# Patient Record
Sex: Female | Born: 1975 | Hispanic: No | Marital: Married | Smoking: Never smoker
Health system: Southern US, Community
[De-identification: ages and names within clinical notes are randomized; demographics above are authoritative.]

## PROBLEM LIST (undated history)

## (undated) DIAGNOSIS — D649 Anemia, unspecified: Secondary | ICD-10-CM

## (undated) HISTORY — DX: Anemia, unspecified: D64.9

---

## 2001-06-05 ENCOUNTER — Other Ambulatory Visit: Admission: RE | Admit: 2001-06-05 | Discharge: 2001-06-05 | Payer: Self-pay | Admitting: Obstetrics & Gynecology

## 2001-11-11 ENCOUNTER — Inpatient Hospital Stay (HOSPITAL_COMMUNITY): Admission: AD | Admit: 2001-11-11 | Discharge: 2001-11-11 | Payer: Self-pay | Admitting: Obstetrics and Gynecology

## 2001-11-13 ENCOUNTER — Encounter: Payer: Self-pay | Admitting: Obstetrics and Gynecology

## 2001-11-13 ENCOUNTER — Inpatient Hospital Stay (HOSPITAL_COMMUNITY): Admission: AD | Admit: 2001-11-13 | Discharge: 2001-11-13 | Payer: Self-pay | Admitting: *Deleted

## 2001-11-16 ENCOUNTER — Inpatient Hospital Stay (HOSPITAL_COMMUNITY): Admission: AD | Admit: 2001-11-16 | Discharge: 2001-11-18 | Payer: Self-pay | Admitting: *Deleted

## 2005-09-18 ENCOUNTER — Inpatient Hospital Stay (HOSPITAL_COMMUNITY): Admission: AD | Admit: 2005-09-18 | Discharge: 2005-09-18 | Payer: Self-pay | Admitting: Obstetrics and Gynecology

## 2005-09-20 ENCOUNTER — Inpatient Hospital Stay (HOSPITAL_COMMUNITY): Admission: AD | Admit: 2005-09-20 | Discharge: 2005-09-20 | Payer: Self-pay | Admitting: Obstetrics and Gynecology

## 2006-03-12 ENCOUNTER — Ambulatory Visit (HOSPITAL_COMMUNITY): Admission: RE | Admit: 2006-03-12 | Discharge: 2006-03-12 | Payer: Self-pay | Admitting: Emergency Medicine

## 2006-03-29 ENCOUNTER — Ambulatory Visit: Payer: Self-pay | Admitting: Family Medicine

## 2006-04-04 ENCOUNTER — Ambulatory Visit: Payer: Self-pay | Admitting: Sports Medicine

## 2006-05-04 ENCOUNTER — Ambulatory Visit: Payer: Self-pay | Admitting: Family Medicine

## 2006-06-05 ENCOUNTER — Ambulatory Visit: Payer: Self-pay | Admitting: Family Medicine

## 2006-06-07 ENCOUNTER — Ambulatory Visit (HOSPITAL_COMMUNITY): Admission: RE | Admit: 2006-06-07 | Discharge: 2006-06-07 | Payer: Self-pay | Admitting: Sports Medicine

## 2006-07-04 ENCOUNTER — Ambulatory Visit: Payer: Self-pay | Admitting: Family Medicine

## 2006-07-31 ENCOUNTER — Ambulatory Visit: Payer: Self-pay | Admitting: Family Medicine

## 2006-08-17 ENCOUNTER — Ambulatory Visit: Payer: Self-pay | Admitting: Family Medicine

## 2006-08-31 ENCOUNTER — Ambulatory Visit: Payer: Self-pay | Admitting: Family Medicine

## 2006-09-14 ENCOUNTER — Ambulatory Visit: Payer: Self-pay | Admitting: Family Medicine

## 2006-09-14 ENCOUNTER — Encounter (INDEPENDENT_AMBULATORY_CARE_PROVIDER_SITE_OTHER): Payer: Self-pay | Admitting: Family Medicine

## 2006-09-28 ENCOUNTER — Ambulatory Visit: Payer: Self-pay | Admitting: Family Medicine

## 2006-10-05 ENCOUNTER — Ambulatory Visit: Payer: Self-pay | Admitting: Family Medicine

## 2006-10-12 ENCOUNTER — Ambulatory Visit: Payer: Self-pay | Admitting: Family Medicine

## 2006-10-14 ENCOUNTER — Inpatient Hospital Stay (HOSPITAL_COMMUNITY): Admission: AD | Admit: 2006-10-14 | Discharge: 2006-10-16 | Payer: Self-pay | Admitting: Obstetrics & Gynecology

## 2006-10-14 ENCOUNTER — Ambulatory Visit: Payer: Self-pay | Admitting: *Deleted

## 2006-12-20 ENCOUNTER — Ambulatory Visit: Payer: Self-pay | Admitting: Family Medicine

## 2006-12-20 DIAGNOSIS — M722 Plantar fascial fibromatosis: Secondary | ICD-10-CM | POA: Insufficient documentation

## 2006-12-20 DIAGNOSIS — M79609 Pain in unspecified limb: Secondary | ICD-10-CM | POA: Insufficient documentation

## 2007-01-18 ENCOUNTER — Ambulatory Visit: Payer: Self-pay | Admitting: Sports Medicine

## 2007-01-18 LAB — CONVERTED CEMR LAB: Beta hcg, urine, semiquantitative: NEGATIVE

## 2015-01-25 ENCOUNTER — Ambulatory Visit: Payer: Self-pay

## 2015-09-10 LAB — POCT GLUCOSE (2 HR PP): Glucose 2 Hr PP, POC: 81 mg/dL

## 2016-12-05 ENCOUNTER — Ambulatory Visit (INDEPENDENT_AMBULATORY_CARE_PROVIDER_SITE_OTHER): Payer: Self-pay | Admitting: Family Medicine

## 2016-12-05 VITALS — BP 123/75 | HR 82 | Temp 98.1°F | Resp 18 | Ht 63.0 in | Wt 146.2 lb

## 2016-12-05 DIAGNOSIS — K5909 Other constipation: Secondary | ICD-10-CM

## 2016-12-05 DIAGNOSIS — R1084 Generalized abdominal pain: Secondary | ICD-10-CM

## 2016-12-05 LAB — POCT URINALYSIS DIP (MANUAL ENTRY)
BILIRUBIN UA: NEGATIVE
GLUCOSE UA: NEGATIVE mg/dL
Ketones, POC UA: NEGATIVE mg/dL
NITRITE UA: NEGATIVE
Protein Ur, POC: NEGATIVE mg/dL
SPEC GRAV UA: 1.02 (ref 1.010–1.025)
Urobilinogen, UA: 0.2 E.U./dL
pH, UA: 7 (ref 5.0–8.0)

## 2016-12-05 NOTE — Progress Notes (Signed)
  Chief Complaint  Patient presents with  . Abdominal Cramping    wants referral to GI   . Abdominal Pain    HPI   Constipation: Patient complains of constipation.  Stool pattern has been 2 formed stool(s) per week. Onset was 12 years ago but 1.5 months ago got really bad.  Defecation has been difficult, incomplete and painful. Co-Morbid conditions:none. Symptoms have been gradually worsening. Current Health Habits: Eating fiber? yes, amt lots of fruits and vegetables and metamucil. Exercise?yes - daily Water intake? yes, amt eight 8 ounce glasses a day. Current OTC/RX therapy has been bulk (psyllium), lubricant (mineral oil) and over the counter laxatives which do not help.   She reports leakage of stool with fecal incontinence twice in the past 1.5 months She reports nausea and upset stomach She is unable to eat a full plate  She has never had a colonoscopy   Past Medical History:  Diagnosis Date  . Anemia     No current outpatient prescriptions on file.   No current facility-administered medications for this visit.     Allergies: No Known Allergies  History reviewed. No pertinent surgical history.  Social History   Social History  . Marital status: Married    Spouse name: N/A  . Number of children: N/A  . Years of education: N/A   Social History Main Topics  . Smoking status: Never Smoker  . Smokeless tobacco: Never Used  . Alcohol use No  . Drug use: No  . Sexual activity: Not Asked   Other Topics Concern  . None   Social History Narrative  . None    ROS  Objective: Vitals:   12/05/16 1634  BP: 123/75  Pulse: 82  Resp: 18  Temp: 98.1 F (36.7 C)  TempSrc: Oral  SpO2: 99%  Weight: 146 lb 3.2 oz (66.3 kg)  Height: 5\' 3"  (1.6 m)    Physical Exam  Constitutional: She appears well-developed and well-nourished.  HENT:  Head: Normocephalic and atraumatic.  Cardiovascular: Normal rate, regular rhythm and normal heart sounds.   Pulmonary/Chest:  Effort normal and breath sounds normal. No respiratory distress. She has no wheezes.  Abdominal: Soft. Bowel sounds are normal. She exhibits no shifting dullness, no distension, no pulsatile liver, no fluid wave, no abdominal bruit, no ascites, no pulsatile midline mass and no mass. There is tenderness.      Holly Miranda as chaperone Rectal exam: normal tone No palpable mass No visible or palpable hemorrhoid    Assessment and Plan Holly Miranda was seen today for abdominal cramping and abdominal pain.  Diagnoses and all orders for this visit:  Generalized abdominal pain -     POCT urinalysis dipstick -     Cancel: POCT Microscopic Urinalysis ([facility]) -     CBC with Differential/Platelet  Chronic constipation -     TSH -     Comprehensive metabolic panel -     Ambulatory referral to Gastroenterology     [place]

## 2016-12-05 NOTE — Patient Instructions (Addendum)
     IF you received an x-ray today, you will receive an invoice from [facility] ([facility] Radiology. Please contact [facility] at [phone] with questions or concerns regarding your invoice.   IF you received labwork today, you will receive an invoice from [place]. Please contact [facility] at [phone] with questions or concerns regarding your invoice.   Our billing staff will not be able to assist you with questions regarding bills from these companies.  You will be contacted with the lab results as soon as they are available. The fastest way to get your results is to activate your My Chart account. Instructions are located on the last page of this paperwork. If you have not heard from [place] regarding the results in 2 weeks, please contact this office.     \ Diverticulosis (Diverticulosis) La diverticulosis es una enfermedad que aparece cuando se forman pequeos bolsillos (divertculos) en las paredes del colon. El colon, o intestino grueso, es el lugar donde se absorbe agua y se forman las heces. Los bolsillos se forman cuando la capa interna del colon ejerce presin sobre los puntos dbiles de las capas externas. CAUSAS Nadie sabe con exactitud qu causa la diverticulosis. FACTORES DE RIESGO  Ser mayor de 3aos. El riesgo de desarrollar esta enfermedad aumenta con la edad. La diverticulosis es poco frecuente en las personas menores de [place]. A los 80aos, casi todas las personas tienen la enfermedad.  Comer una dieta con bajo contenido de [place].  Estar estreido con frecuencia.  Tener sobrepeso.  No hacer suficiente ejercicio fsico.  Fumar.  Tomar analgsicos de venta libre, como aspirina e ibuprofeno. SNTOMAS La mayora de las personas que tienen diverticulosis no presentan sntomas. DIAGNSTICO Dado que la diverticulosis no suele causar sntomas, los mdicos a menudo descubren la enfermedad durante un examen de otros problemas de colon. En muchos casos, el mdico  diagnosticar la diverticulosis mientras utiliza un endoscopio flexible para examinar el colon (colonoscopa). TRATAMIENTO Si nunca tuvo una infeccin relacionada con la diverticulosis, es posible que no necesite tratamiento. Si ha tenido una infeccin antes, el tratamiento puede incluir:  Comer ms frutas, verduras y cereales.  Tomar un suplemento de [place].  Tomar un suplemento de bacterias vivas (probitico).  Tomar medicamentos para relajar el colon. INSTRUCCIONES PARA EL CUIDADO EN EL HOGAR  Beba por lo menos entre 6 y 8vasos de agua por da para Engineer, civil (consulting).  Trate de no hacer fuerza al mover el intestino.  Cumpla con todas las visitas de control. Si ha tenido una infeccin antes:  Aumente la cantidad de fibra en la dieta, segn las indicaciones del mdico o del nutricionista.  Tome un suplemento dietario con fibras si el mdico lo autoriza.  Tome los medicamentos solamente como se lo haya indicado el mdico. SOLICITE ATENCIN MDICA SI:  Siente dolor abdominal.  Tiene meteorismo.  Tiene clicos.  No ha defecado en 3das. SOLICITE ATENCIN MDICA DE INMEDIATO SI:  El dolor empeora.  El meteorismo [employer].  Tiene fiebre o escalofros, y los sntomas empeoran repentinamente.  Comienza a vomitar.  La materia fecal es sanguinolenta o negra. ASEGRESE DE QUE:  Comprende estas instrucciones.  Controlar su afeccin.  Recibir ayuda de inmediato si no mejora o si empeora. Esta informacin no tiene Marine scientist el consejo del mdico. Asegrese de hacerle al mdico cualquier pregunta que tenga. Document Released: 07/13/2008 Document Revised: 08/05/2013 Document Reviewed: 06/25/2013 Elsevier Interactive Patient Education  2017 [employer].

## 2016-12-06 ENCOUNTER — Encounter: Payer: Self-pay | Admitting: Nurse Practitioner

## 2016-12-06 LAB — COMPREHENSIVE METABOLIC PANEL
A/G RATIO: 1.4 (ref 1.2–2.2)
ALT: 10 IU/L (ref 0–32)
AST: 12 IU/L (ref 0–40)
Albumin: 4.2 g/dL (ref 3.5–5.5)
Alkaline Phosphatase: 60 IU/L (ref 39–117)
BUN/Creatinine Ratio: 15 (ref 9–23)
BUN: 10 mg/dL (ref 6–24)
Bilirubin Total: <0.2 mg/dL (ref 0.0–1.2)
CALCIUM: 9 mg/dL (ref 8.7–10.2)
CO2: 23 mmol/L (ref 18–29)
CREATININE: 0.65 mg/dL (ref 0.57–1.00)
Chloride: 101 mmol/L (ref 96–106)
GFR calc Af Amer: 128 mL/min/{1.73_m2} (ref >59)
GFR, EST NON AFRICAN AMERICAN: 111 mL/min/{1.73_m2} (ref >59)
Globulin, Total: 3 g/dL (ref 1.5–4.5)
Glucose: 89 mg/dL (ref 65–99)
POTASSIUM: 3.7 mmol/L (ref 3.5–5.2)
Sodium: 140 mmol/L (ref 134–144)
Total Protein: 7.2 g/dL (ref 6.0–8.5)

## 2016-12-06 LAB — CBC WITH DIFFERENTIAL/PLATELET
BASOS ABS: 0 10*3/uL (ref 0.0–0.2)
Basos: 0 %
EOS (ABSOLUTE): 0.3 10*3/uL (ref 0.0–0.4)
EOS: 3 %
HEMATOCRIT: 38.5 % (ref 34.0–46.6)
Hemoglobin: 12.7 g/dL (ref 11.1–15.9)
IMMATURE GRANS (ABS): 0 10*3/uL (ref 0.0–0.1)
IMMATURE GRANULOCYTES: 0 %
LYMPHS: 32 %
Lymphocytes Absolute: 2.9 10*3/uL (ref 0.7–3.1)
MCH: 30 pg (ref 26.6–33.0)
MCHC: 33 g/dL (ref 31.5–35.7)
MCV: 91 fL (ref 79–97)
MONOCYTES: 6 %
Monocytes Absolute: 0.5 10*3/uL (ref 0.1–0.9)
Neutrophils Absolute: 5.3 10*3/uL (ref 1.4–7.0)
Neutrophils: 59 %
Platelets: 302 10*3/uL (ref 150–379)
RBC: 4.24 x10E6/uL (ref 3.77–5.28)
RDW: 13.7 % (ref 12.3–15.4)
WBC: 9.1 10*3/uL (ref 3.4–10.8)

## 2016-12-06 LAB — TSH: TSH: 2.56 u[IU]/mL (ref 0.450–4.500)

## 2016-12-12 ENCOUNTER — Encounter: Payer: Self-pay | Admitting: Nurse Practitioner

## 2016-12-12 ENCOUNTER — Ambulatory Visit (INDEPENDENT_AMBULATORY_CARE_PROVIDER_SITE_OTHER): Payer: Self-pay | Admitting: Nurse Practitioner

## 2016-12-12 VITALS — BP 112/78 | HR 76 | Ht 63.0 in | Wt 145.6 lb

## 2016-12-12 DIAGNOSIS — R110 Nausea: Secondary | ICD-10-CM

## 2016-12-12 DIAGNOSIS — R6881 Early satiety: Secondary | ICD-10-CM

## 2016-12-12 DIAGNOSIS — K5909 Other constipation: Secondary | ICD-10-CM

## 2016-12-12 NOTE — Progress Notes (Signed)
HPI:  Patient is a 41 year old female, Spanish speaking, referred by PCP Delia Chimes, MD for constipation.  Patient's sister is here to help translate/interpret. Patient has chronic constipation (12+ years). Stools used to be hard and infrequent, now not necessarily hard but very thin like a "noodle". Frequency about three times a week.She has occasional fecal leakage.  Over the years she has tried MiraLAX, various laxatives, fiber, milk of magnesia, and stool softeners.  All these things help for about a week then lose efficacy. She incorporates fiber into her diet and drinks plenty of water .She never feels that her rectum is emptied. No associated bleeding. No weight loss. She has lower abdominal pain that radiates upward towards the epigastrium as well as around right lower back. The pain has been present for a couple of months. She does get some relief after a bowel movement.   Patient also gives a several week history of early satiety and postprandial nausea. She has not lost any weight due to the symptoms.. No medications at home. She is sexually active but uses condoms. Just finished her menstrual cycle. Recent labs at PCPs including a CMET, CBC and TSH were all normal. Urinalysis normal. No known family history of gastrointestinal diseases or cancers.   Past Medical History:  Diagnosis Date  . Anemia     PSH: no surgeries  Family History  Problem Relation Age of Onset  . Colitis Mother    Social History  Substance Use Topics  . Smoking status: Never Smoker  . Smokeless tobacco: Never Used  . Alcohol use No   No current outpatient prescriptions on file.   No current facility-administered medications for this visit.    No Known Allergies   Review of Systems: Positive for back pain, vision changes, depression, headaches, muscle pain and sleeping problems  All other systems reviewed and negative except where noted in HPI.    Physical Exam: BP 112/78   Pulse 76    Ht 5\' 3"  (1.6 m)   Wt 145 lb 9.6 oz (66 kg)   LMP 11/23/2016 (Approximate)   SpO2 98%   BMI 25.79 kg/m  Constitutional:  Well-developed Hispanic female in no acute distress. Psychiatric: Normal mood and affect. Behavior is normal. EENT: Conjunctivae are normal. No scleral icterus. Neck supple.  Cardiovascular: Normal rate, regular rhythm.  Pulmonary/chest: Effort normal and breath sounds normal. No wheezing, rales or rhonchi. Abdominal: Soft, nondistended, nontender. Bowel sounds active throughout. There are no masses palpable. No hepatomegaly. Extremities: no edema Lymphadenopathy: No cervical adenopathy noted. Neurological: Alert and oriented to person place and time. Skin: Skin is warm and dry. No rashes noted.   ASSESSMENT AND PLAN:  1. 41 yo Spanish speaking female with chronic constipation (years) characterized by infrequent,hard stools. Lately stools have become very thin, "like a noodle'. Now with two month history of lower abdominal pain radiating upward to epigastrium and around to lower back. No associated rectal bleeding, weight loss nor anemia.  -she has tried multiple agents through the years. Will try Linzess. If it helps then will see if she qualifies for patient assistance. Will see her back in 2-3 weeks. If no improvement then will need to discuss colonoscopy for further evaluation. .  -Abdominal pain may be from constipation. Further workup will depend on clinical course  2. Two month history of early satiety / post-prandial nausea.  Again, labs and stable weight are reassuring.  -Will see her back in a few weeks. If symptoms  persists then will arrange for EGD, possibly at time of colonoscopy if she ends up needing one.   Tye Savoy, NP  12/12/2016, 10:33 AM  Cc: Forrest Moron, MD

## 2016-12-12 NOTE — Patient Instructions (Addendum)
If you are age 40 or older, your body mass index should be between 23-30. Your Body mass index is 25.79 kg/m. If this is out of the aforementioned range listed, please consider follow up with your Primary Care Provider.  If you are age 57 or younger, your body mass index should be between 19-25. Your Body mass index is 25.79 kg/m. If this is out of the aformentioned range listed, please consider follow up with your Primary Care Provider.   You have been given samples of Linzess 35mcg  To take once daily.  Please let us know if this works for you.  We will contact the company and try to get patient assistance for this medication.  Follow up appointment with Tye Savoy, NP on 12/27/16 at 1100 am.   Thank you for choosing me and [facility].  Tye Savoy, NP

## 2016-12-13 ENCOUNTER — Encounter: Payer: Self-pay | Admitting: Family Medicine

## 2016-12-14 NOTE — Progress Notes (Signed)
I agree with the above note, plan 

## 2016-12-27 ENCOUNTER — Ambulatory Visit (INDEPENDENT_AMBULATORY_CARE_PROVIDER_SITE_OTHER): Payer: Self-pay | Admitting: Physician Assistant

## 2016-12-27 ENCOUNTER — Other Ambulatory Visit (INDEPENDENT_AMBULATORY_CARE_PROVIDER_SITE_OTHER): Payer: Self-pay

## 2016-12-27 ENCOUNTER — Encounter: Payer: Self-pay | Admitting: Physician Assistant

## 2016-12-27 VITALS — BP 108/76 | Ht 62.0 in | Wt 144.0 lb

## 2016-12-27 DIAGNOSIS — R195 Other fecal abnormalities: Secondary | ICD-10-CM

## 2016-12-27 DIAGNOSIS — K5909 Other constipation: Secondary | ICD-10-CM

## 2016-12-27 DIAGNOSIS — R194 Change in bowel habit: Secondary | ICD-10-CM

## 2016-12-27 DIAGNOSIS — R6881 Early satiety: Secondary | ICD-10-CM

## 2016-12-27 DIAGNOSIS — R1010 Upper abdominal pain, unspecified: Secondary | ICD-10-CM

## 2016-12-27 DIAGNOSIS — R634 Abnormal weight loss: Secondary | ICD-10-CM

## 2016-12-27 LAB — COMPREHENSIVE METABOLIC PANEL
ALT: 13 U/L (ref 0–35)
AST: 15 U/L (ref 0–37)
Albumin: 4.1 g/dL (ref 3.5–5.2)
Alkaline Phosphatase: 52 U/L (ref 39–117)
BILIRUBIN TOTAL: 0.3 mg/dL (ref 0.2–1.2)
BUN: 7 mg/dL (ref 6–23)
CO2: 29 meq/L (ref 19–32)
Calcium: 9.4 mg/dL (ref 8.4–10.5)
Chloride: 102 mEq/L (ref 96–112)
Creatinine, Ser: 0.63 mg/dL (ref 0.40–1.20)
GFR: 111.06 mL/min (ref >60.00)
GLUCOSE: 89 mg/dL (ref 70–99)
Potassium: 4.1 mEq/L (ref 3.5–5.1)
SODIUM: 137 meq/L (ref 135–145)
Total Protein: 7.3 g/dL (ref 6.0–8.3)

## 2016-12-27 LAB — LIPASE: LIPASE: 13 U/L (ref 11.0–59.0)

## 2016-12-27 MED ORDER — NA SULFATE-K SULFATE-MG SULF 17.5-3.13-1.6 GM/177ML PO SOLN: 1.0000 | Freq: Once | ORAL | 0 refills | Status: AC

## 2016-12-27 MED ORDER — PANTOPRAZOLE SODIUM 40 MG PO TBEC: 40.0000 mg | DELAYED_RELEASE_TABLET | ORAL | 1 refills | Status: DC

## 2016-12-27 NOTE — Progress Notes (Signed)
Subjective:    Patient ID: DODI LEU, female    DOB: 30-Mar-1976, 41 y.o.   MRN: [id number]  HPI Holly Miranda  is a pleasant 41 year old Hispanic female, new to GI today referred by Claris Gower M.D. [facility] for complaints of upper abdominal pain. Patient has generally been in good health. She has not had any abdominal surgeries. She says she has had chronic problems with constipation for at least the past 12 years. She is seeking medical care because she has had worsening abdominal pain over the past couple of months. Her sister says been having a very difficult time eating and has only been eating very small amounts because she fills up very quickly. She gets very uncomfortable after eating has had some nausea but no vomiting. Her weight is down 45 pounds. She has had more constant epigastric discomfort over the past 3 days and also complains of some left upper quadrant pain that radiates around into her back. Fever or chills. No regular heartburn or indigestion and no dysphagia. Again she's had constipation for a very long time with a bowel movement every 2-4 days. She says when she is having bowel movements now she's passing very narrow small stools. She's not noted any melena or hematochezia. She was given a short trial of Lynn's S 72 g daily and says this worked the first day and then wasn't helpful after that.  Family history negative for colon cancer mother does have history of "colitis". Fairly she did have labs done through family medicine including CBC and TSH both of which were normal by report.  Review of Systems Pertinent positive and negative review of systems were noted in the above HPI section.  All other review of systems was otherwise negative.  Outpatient Encounter Prescriptions as of 12/27/2016  Medication Sig  . linaclotide (LINZESS) 72 MCG capsule Take 72 mcg by mouth daily before breakfast. Samples given # 12  Lot number [id number] exp. 08/18  Pt to call and let us  know how this is working for her.  . Na Sulfate-K Sulfate-Mg Sulf 17.5-3.13-1.6 GM/180ML SOLN Take 1 kit by mouth once.  . pantoprazole (PROTONIX) 40 MG tablet Take 1 tablet (40 mg total) by mouth every morning.   No facility-administered encounter medications on file as of 12/27/2016.    No Known Allergies Patient Active Problem List   Diagnosis Date Noted  . PLANTAR FASCIITIS, LEFT 12/20/2006  . FOOT PAIN, BILATERAL 12/20/2006   Social History   Social History  . Marital status: Married    Spouse name: N/A  . Number of children: N/A  . Years of education: N/A   Occupational History  . Not on file.   Social History Main Topics  . Smoking status: Never Smoker  . Smokeless tobacco: Never Used  . Alcohol use No  . Drug use: No  . Sexual activity: Not on file   Other Topics Concern  . Not on file   Social History Narrative  . No narrative on file    Holly Miranda's family history includes Colitis in her mother.      Objective:    Vitals:   12/27/16 1102  BP: 108/76    Physical Exam  well-developed Hispanic female, accompanied by her sister who does interpreting. Blood pressure 108/76, height 5 foot 2, weight 144, BMI 26.3. HEENT; nontraumatic normocephalic EOMI PERRLA sclera anicteric, Cardiovascular ;regular rate and rhythm with S1-S2 no murmur rub or gallop, Pulmonary; clear bilaterally, Abdomen; soft, she is  tender across the upper abdomen in the epigastrium and right upper quadrant, and left upper quadrant, there is no guarding or rebound no palpable mass or hepatosplenomegaly no succussion splash, Rectal; exam, Not done, Extremities ;no clubbing cyanosis or edema skin warm and dry, Neuropsych ;mood and affect appropriate       Assessment & Plan:   #60 41 year old Hispanic female with 2-3 month history of upper abdominal pain, early satiety and recent weight loss of 45 pounds. Etiology of symptoms is not clear, rule out idiopathic gastroparesis, peptic ulcer  disease, occult malignancy, gallbladder disease. #2 Chronic constipation with recent worsening  Plan; CMET, lipase, sedimentation rate, Will schedule for upper abdominal ultrasound Schedule for EGD and colonoscopy with Dr. Silverio Decamp. Both procedures discussed in detail with patient and she is agreeable to proceed. Start short trial of Protonix 40 mg by mouth every morning 1 month Patient was given samples of Amitiza today 24 g twice a day. If this is helpful she is to call back for a prescription.   Amy S Esterwood PA-C 12/27/2016   Cc: Forrest Moron, MD

## 2016-12-27 NOTE — Patient Instructions (Addendum)
If you are age 40 or older, your body mass index should be between 23-30. Your Body mass index is 26.34 kg/m. If this is out of the aforementioned range listed, please consider follow up with your Primary Care Provider.  If you are age 18 or younger, your body mass index should be between 19-25. Your Body mass index is 26.34 kg/m. If this is out of the aformentioned range listed, please consider follow up with your Primary Care Provider.   You have been scheduled for an endoscopy and colonoscopy. Please follow the written instructions given to you at your visit today. Please pick up your prep supplies at the pharmacy within the next 1-3 days. If you use inhalers (even only as needed), please bring them with you on the day of your procedure. Your physician has requested that you go to www.startemmi.com and enter the access code given to you at your visit today. This web site gives a general overview about your procedure. However, you should still follow specific instructions given to you by our office regarding your preparation for the procedure.  You have been scheduled for an abdominal ultrasound at [facility] (1st floor of hospital) on 01/02/17 at 1100 am. Please arrive 15 minutes prior to your appointment for registration. Make certain not to have anything to eat or drink 6 hours prior to your appointment. Should you need to reschedule your appointment, please contact radiology at [phone]. This test typically takes about 30 minutes to perform.  Your physician has requested that you go to the basement for lab work before leaving today.  We have sent the following medications to your pharmacy for you to pick up at your convenience: Protonix  You have been given Samples of Amitiza 24 mcg twice daily. Call back for a prescription if helpful.  Thank you for choosing me and [facility].  Amy Esterwood, PA-C

## 2017-01-02 ENCOUNTER — Ambulatory Visit (HOSPITAL_COMMUNITY)
Admission: RE | Admit: 2017-01-02 | Discharge: 2017-01-02 | Disposition: A | Discharge status: Home / Self Care | Payer: Self-pay | Source: Ambulatory Visit | Attending: Physician Assistant | Admitting: Physician Assistant

## 2017-01-02 DIAGNOSIS — R1010 Upper abdominal pain, unspecified: Secondary | ICD-10-CM | POA: Insufficient documentation

## 2017-01-02 DIAGNOSIS — R6881 Early satiety: Secondary | ICD-10-CM | POA: Insufficient documentation

## 2017-01-02 DIAGNOSIS — R634 Abnormal weight loss: Secondary | ICD-10-CM | POA: Insufficient documentation

## 2017-01-02 DIAGNOSIS — K5909 Other constipation: Secondary | ICD-10-CM | POA: Insufficient documentation

## 2017-01-09 ENCOUNTER — Ambulatory Visit (AMBULATORY_SURGERY_CENTER): Payer: Self-pay | Admitting: Gastroenterology

## 2017-01-09 ENCOUNTER — Encounter: Payer: Self-pay | Admitting: Gastroenterology

## 2017-01-09 VITALS — BP 103/67 | HR 67 | Temp 98.4°F | Resp 20 | Ht 62.0 in | Wt 144.0 lb

## 2017-01-09 DIAGNOSIS — D120 Benign neoplasm of cecum: Secondary | ICD-10-CM

## 2017-01-09 DIAGNOSIS — K2990 Gastroduodenitis, unspecified, without bleeding: Secondary | ICD-10-CM

## 2017-01-09 DIAGNOSIS — R1010 Upper abdominal pain, unspecified: Secondary | ICD-10-CM

## 2017-01-09 DIAGNOSIS — K5909 Other constipation: Secondary | ICD-10-CM

## 2017-01-09 DIAGNOSIS — K2970 Gastritis, unspecified, without bleeding: Secondary | ICD-10-CM

## 2017-01-09 MED ORDER — SODIUM CHLORIDE 0.9 % IV SOLN: 500.0000 mL | INTRAVENOUS | Status: DC

## 2017-01-09 NOTE — Progress Notes (Signed)
Spontaneous respirations throughout. VSS. Resting comfortably. To PACU on room air. Report to  Annette RN.  

## 2017-01-09 NOTE — Progress Notes (Signed)
Pt's states no medical or surgical changes since previsit or office visit. 

## 2017-01-09 NOTE — Op Note (Signed)
[facility] Patient Name: Holly Miranda Procedure Date: 01/09/2017 1:14 PM MRN: [id number] Endoscopist: Mauri Pole , MD Age: 41 Referring MD:  Date of Birth: 14-Apr-1976 Gender: Female Account #: [id number] Procedure:                Colonoscopy Indications:              Upper abdominal pain, Constipation, Weight loss Medicines:                Monitored Anesthesia Care Procedure:                Pre-Anesthesia Assessment:                           - Prior to the procedure, a History and Physical                            was performed, and patient medications and                            allergies were reviewed. The patient's tolerance of                            previous anesthesia was also reviewed. The risks                            and benefits of the procedure and the sedation                            options and risks were discussed with the patient.                            All questions were answered, and informed consent                            was obtained. Prior Anticoagulants: The patient has                            taken no previous anticoagulant or antiplatelet                            agents. ASA Grade Assessment: II - A patient with                            mild systemic disease. After reviewing the risks                            and benefits, the patient was deemed in                            satisfactory condition to undergo the procedure.                           After obtaining informed consent, the colonoscope  was passed under direct vision. Throughout the                            procedure, the patient's blood pressure, pulse, and                            oxygen saturations were monitored continuously. The                            Model CF-HQ190L [id number]) scope was introduced                            through the anus and advanced to the the terminal                            ileum,  with identification of the appendiceal                            orifice and IC valve. The colonoscopy was performed                            without difficulty. The patient tolerated the                            procedure well. The quality of the bowel                            preparation was excellent. The terminal ileum,                            ileocecal valve, appendiceal orifice, and rectum                            were photographed. Scope In: 1:28:22 PM Scope Out: 1:45:28 PM Scope Withdrawal Time: 0 hours 11 minutes 7 seconds  Total Procedure Duration: 0 hours 17 minutes 6 seconds  Findings:                 The perianal and digital rectal examinations were                            normal.                           A 1 mm polyp was found in the cecum. The polyp was                            sessile. The polyp was removed with a cold biopsy                            forceps. Resection and retrieval were complete.                           A few small-mouthed diverticula were found in the  sigmoid colon.                           Narrow rectal vault.The exam was otherwise without                            abnormality. Complications:            No immediate complications. Estimated Blood Loss:     Estimated blood loss was minimal. Impression:               - One 1 mm polyp in the cecum, removed with a cold                            biopsy forceps. Resected and retrieved.                           - Diverticulosis in the sigmoid colon.                           - The examination was otherwise normal. Recommendation:           - Patient has a contact number available for                            emergencies. The signs and symptoms of potential                            delayed complications were discussed with the                            patient. Return to normal activities tomorrow.                            Written discharge  instructions were provided to the                            patient.                           - Resume previous diet.                           - Continue present medications.                           - Await pathology results.                           - Repeat colonoscopy in 5-10 years for surveillance                            based on pathology results. Mauri Pole, MD 01/09/2017 1:56:02 PM This report has been signed electronically.

## 2017-01-09 NOTE — Progress Notes (Signed)
No problems noted in the recovery room. maw 

## 2017-01-09 NOTE — Progress Notes (Signed)
Pt asked what her ultrasound results were.   Dr. Silverio Decamp said results were normal.  Dr. Silverio Decamp spoke with pt and her family. maw

## 2017-01-09 NOTE — Progress Notes (Signed)
Per Pt's sister, Holly Miranda, pt and her husband speak some english.  Pt's sister will be the translator for them.  Maw  Per Dr. Silverio Decamp give pt handouts on Gastritis, barretts, polyps and diverticulosis in spanish. maw

## 2017-01-09 NOTE — Patient Instructions (Addendum)
YOU HAD AN ENDOSCOPIC PROCEDURE TODAY AT [facility]:   Refer to the procedure report that was given to you for any specific questions about what was found during the examination.  If the procedure report does not answer your questions, please call your gastroenterologist to clarify.  If you requested that your care partner not be given the details of your procedure findings, then the procedure report has been included in a sealed envelope for you to review at your convenience later.  YOU SHOULD EXPECT: Some feelings of bloating in the abdomen. Passage of more gas than usual.  Walking can help get rid of the air that was put into your GI tract during the procedure and reduce the bloating. If you had a lower endoscopy (such as a colonoscopy or flexible sigmoidoscopy) you may notice spotting of blood in your stool or on the toilet paper. If you underwent a bowel prep for your procedure, you may not have a normal bowel movement for a few days.  Please Note:  You might notice some irritation and congestion in your nose or some drainage.  This is from the oxygen used during your procedure.  There is no need for concern and it should clear up in a day or so.  SYMPTOMS TO REPORT IMMEDIATELY:   Following lower endoscopy (colonoscopy or flexible sigmoidoscopy):  Excessive amounts of blood in the stool  Significant tenderness or worsening of abdominal pains  Swelling of the abdomen that is new, acute  Fever of 100F or higher   Following upper endoscopy (EGD)  Vomiting of blood or coffee ground material  New chest pain or pain under the shoulder blades  Painful or persistently difficult swallowing  New shortness of breath  Fever of 100F or higher  Black, tarry-looking stools  For urgent or emergent issues, a gastroenterologist can be reached at any hour by calling [phone].   DIET:  We do recommend a small meal at first, but then you may proceed to your regular diet.  Drink  plenty of fluids but you should avoid alcoholic beverages for 24 hours.  ACTIVITY:  You should plan to take it easy for the rest of today and you should NOT DRIVE or use heavy machinery until tomorrow (because of the sedation medicines used during the test).    FOLLOW UP: Our staff will call the number listed on your records the next business day following your procedure to check on you and address any questions or concerns that you may have regarding the information given to you following your procedure. If we do not reach you, we will leave a message.  However, if you are feeling well and you are not experiencing any problems, there is no need to return our call.  We will assume that you have returned to your regular daily activities without incident.  If any biopsies were taken you will be contacted by phone or by letter within the next 1-3 weeks.  Please call us at [phone] if you have not heard about the biopsies in 3 weeks.    SIGNATURES/CONFIDENTIALITY: You and/or your care partner have signed paperwork which will be entered into your electronic medical record.  These signatures attest to the fact that that the information above on your After Visit Summary has been reviewed and is understood.  Full responsibility of the confidentiality of this discharge information lies with you and/or your care-partner.    Handouts were given to your care partner on gastritis,  barrett's esophagus,  polyps and diverticulosis. You may resume your current medications today. Await biopsy results. Return to clinic for an appointment with Dr. Silverio Decamp for next available appointment in next 2-3 months. Please call if any questions or concerns.

## 2017-01-09 NOTE — Op Note (Signed)
[facility] Patient Name: Holly Miranda Procedure Date: 01/09/2017 1:15 PM MRN: [id number] Endoscopist: Mauri Pole , MD Age: 41 Referring MD:  Date of Birth: [birth year] Gender: Female Account #: [id number] Procedure:                Upper GI endoscopy Indications:              Upper abdominal symptoms that persist despite an                            appropriate trial of therapy, Anorexia, Weight loss Medicines:                Monitored Anesthesia Care Procedure:                Pre-Anesthesia Assessment:                           - Prior to the procedure, a History and Physical                            was performed, and patient medications and                            allergies were reviewed. The patient's tolerance of                            previous anesthesia was also reviewed. The risks                            and benefits of the procedure and the sedation                            options and risks were discussed with the patient.                            All questions were answered, and informed consent                            was obtained. Prior Anticoagulants: The patient has                            taken no previous anticoagulant or antiplatelet                            agents. ASA Grade Assessment: II - A patient with                            mild systemic disease. After reviewing the risks                            and benefits, the patient was deemed in                            satisfactory condition to undergo the procedure.  After obtaining informed consent, the endoscope was                            passed under direct vision. Throughout the                            procedure, the patient's blood pressure, pulse, and                            oxygen saturations were monitored continuously. The                            Endoscope was introduced through the mouth, and   advanced to the second part of duodenum. The upper                            GI endoscopy was accomplished without difficulty.                            The patient tolerated the procedure well. Scope In: Scope Out: Findings:                 There were esophageal mucosal changes suspicious                            for short-segment Barrett's esophagus present in                            the lower third of the esophagus. Z-line at 36cm                            from incisssors. The maximum longitudinal extent of                            these mucosal changes was 2 cm in length, 3                            tongues. C1M2. Mucosa was biopsied with a cold                            forceps for histology in a targeted manner at                            intervals of 1 cm in the lower third of the                            esophagus. One specimen bottle was sent to                            pathology.                           Patchy mildly erythematous mucosa without bleeding  was found in the entire examined stomach. Biopsies                            were taken with a cold forceps for Helicobacter                            pylori testing.                           The examined duodenum was normal. Complications:            No immediate complications. Estimated Blood Loss:     Estimated blood loss was minimal. Impression:               - Esophageal mucosal changes suspicious for                            short-segment Barrett's esophagus. Biopsied.                           - Erythematous mucosa in the stomach. Biopsied.                           - Normal examined duodenum. Recommendation:           - Patient has a contact number available for                            emergencies. The signs and symptoms of potential                            delayed complications were discussed with the                            patient. Return to normal  activities tomorrow.                            Written discharge instructions were provided to the                            patient.                           - Resume previous diet.                           - Continue present medications.                           - Await pathology results.                           - Repeat upper endoscopy after studies are complete                            for surveillance based on pathology results.                           -  Return to GI clinic at the next available                            appointment in next 2-3 months. Mauri Pole, MD 01/09/2017 1:53:22 PM This report has been signed electronically.

## 2017-01-09 NOTE — Progress Notes (Signed)
Called to room to assist during endoscopic procedure.  Patient ID and intended procedure confirmed with present staff. Received instructions for my participation in the procedure from the performing physician.  

## 2017-01-10 ENCOUNTER — Telehealth: Payer: Self-pay | Admitting: *Deleted

## 2017-01-10 NOTE — Telephone Encounter (Signed)
Telephone number provided by patient is a non working or disconnected number.

## 2017-01-11 NOTE — Progress Notes (Signed)
Reviewed and agree with documentation and assessment and plan. K. Veena Nandigam , MD   

## 2017-01-16 ENCOUNTER — Encounter (INDEPENDENT_AMBULATORY_CARE_PROVIDER_SITE_OTHER): Payer: Self-pay | Admitting: Physician Assistant

## 2017-01-16 ENCOUNTER — Ambulatory Visit (INDEPENDENT_AMBULATORY_CARE_PROVIDER_SITE_OTHER): Payer: Self-pay | Admitting: Physician Assistant

## 2017-01-16 VITALS — BP 115/72 | HR 63 | Temp 97.8°F | Ht 63.0 in | Wt 146.6 lb

## 2017-01-16 DIAGNOSIS — K5900 Constipation, unspecified: Secondary | ICD-10-CM

## 2017-01-16 DIAGNOSIS — Z23 Encounter for immunization: Secondary | ICD-10-CM

## 2017-01-16 DIAGNOSIS — R1084 Generalized abdominal pain: Secondary | ICD-10-CM

## 2017-01-16 DIAGNOSIS — R5383 Other fatigue: Secondary | ICD-10-CM

## 2017-01-16 DIAGNOSIS — A048 Other specified bacterial intestinal infections: Secondary | ICD-10-CM

## 2017-01-16 MED ORDER — CLARITHROMYCIN 500 MG PO TABS: 500.0000 mg | ORAL_TABLET | Freq: Two times a day (BID) | ORAL | 0 refills | Status: AC

## 2017-01-16 MED ORDER — PANTOPRAZOLE SODIUM 40 MG PO TBEC: 40.0000 mg | DELAYED_RELEASE_TABLET | Freq: Two times a day (BID) | ORAL | 0 refills | Status: DC

## 2017-01-16 MED ORDER — PHILLIPS COLON HEALTH PO CAPS: 1.0000 | ORAL_CAPSULE | Freq: Every day | ORAL | 2 refills | Status: DC

## 2017-01-16 MED ORDER — AMOXICILLIN 500 MG PO TABS: 1000.0000 mg | ORAL_TABLET | Freq: Two times a day (BID) | ORAL | 0 refills | Status: AC

## 2017-01-16 MED ORDER — VITAMIN D-3 125 MCG (5000 UT) PO TABS: 1.0000 | ORAL_TABLET | Freq: Every day | ORAL | 0 refills | Status: AC

## 2017-01-16 NOTE — Patient Instructions (Signed)
Infeccin por Helicobacter Pylori (Helicobacter Pylori Infection) La infeccin por Helicobacter pylori es una infeccin en el estmago que es causada por la bacteria Helicobacter pylori (H. pylori). Este tipo de bacteria vive frecuentemente en el revestimiento del estmago. La infeccin puede causar lceras e irritacin (gastritis) en algunas personas. Es la causa ms comn de lceras en el estmago (lcera gstrica) y en la parte superior del intestino (lcera duodenal). Tener esta infeccin tambin puede aumentar el riesgo de cncer de estmago y un tipo de cncer de los glbulos blancos (linfoma) que afecta al estmago. CAUSAS H. pylori es un tipo de bacteria que se encuentra frecuentemente en el estmago de las personas saludables. La bacteria puede pasar de una persona a otra por contacto a travs de las heces o la saliva. No se sabe por qu algunas personas desarrollan lceras, gastritis o cncer a partir de la infeccin. FACTORES DE RIESGO Es ms probable que esta afeccin se manifieste en las personas que:  Tienen familiares con esta infeccin.  Viven con muchas otras personas; por ejemplo, en un dormitorio.  Son de origen africano, hispano o asitico. SNTOMAS La mayora de las personas con esta infeccin no tienen sntomas. Si tiene sntomas, estos pueden incluir los siguientes:  Acidez estomacal.  Dolor de estmago.  Nuseas.  Vmitos.  Sangre en el vmito.  Prdida del apetito.  Mal aliento. DIAGNSTICO Esta afeccin se puede diagnosticar en funcin de los sntomas, un examen fsico y diferentes pruebas. Podrn solicitarle otros estudios, por ejemplo:  Anlisis de sangre o pruebas de materia fecal para verificar las protenas (anticuerpos) que el cuerpo puede producir en respuesta a las bacterias. Estas pruebas son la mejor manera de confirmar el diagnstico.  Una prueba de aliento para verificar el tipo de gas que la bacteria H. pylori libera despus de descomponer una  sustancia llamada urea. Para la prueba, se le pide que beba urea. Esta prueba se realiza frecuentemente despus del tratamiento para saber si el tratamiento funcion.  Un procedimiento en el que un tubo delgado y flexible con una pequea cmara en el extremo se coloca en el estmago y el intestino superior (endoscopia superior). El mdico tambin puede tomar muestras de tejido (biopsia) para realizar pruebas de H. pylori y cncer. TRATAMIENTO El tratamiento para esta afeccin generalmente implica tomar una combinacin de medicamentos (terapia triple) durante un par de semanas. La terapia triple incluye un medicamento para reducir el cido en el estmago y dos tipos de antibiticos. Se aprobaron muchas combinaciones de frmacos para el tratamiento. El tratamiento generalmente mata a la H. pylori y reduce el riesgo de cncer. Despus del tratamiento, es posible que deba volver a realizarse una prueba de H. pylori. En algunos casos, es posible que sea necesario repetir el tratamiento. INSTRUCCIONES PARA EL CUIDADO EN EL HOGAR  Tome los medicamentos de venta libre y los recetados solamente como se lo haya indicado el mdico.  Tome los antibiticos como se lo haya indicado el mdico. No deje de tomar los antibiticos aunque comience a sentirse mejor.  Puede retomar todas sus actividades habituales y continuar su dieta habitual.  Tome estas medidas para evitar infecciones futuras: ? Lvese las manos con frecuencia. ? Asegrese de que los alimentos que consume se hayan preparado adecuadamente. ? Beba agua solamente de fuentes limpias.  Concurra a todas las visitas de control como se lo haya indicado el mdico. Esto es importante. SOLICITE ATENCIN MDICA SI:  Los sntomas no mejoran.  Los sntomas regresan despus del tratamiento. Esta   informacin no tiene Marine scientist el consejo del mdico. Asegrese de hacerle al mdico cualquier pregunta que tenga. Document Released: 11/22/2015 Document  Revised: 11/22/2015 Document Reviewed: 08/12/2014 Elsevier Interactive Patient Education  2018 [place] (Fatigue) La fatiga es una sensacin de cansancio en todo momento, falta de energa o falta de motivacin. La fatiga ocasional o leve con frecuencia es una reaccin normal a la actividad o la vida en general. Sin embargo, la fatiga de Engineer, site duracin (crnica) o extrema puede indicar una enfermedad preexistente. INSTRUCCIONES PARA EL CUIDADO EN EL HOGAR Controle su fatiga para ver si hay cambios. Las siguientes indicaciones ayudarn a [employer] pueda sentir:  Hable con el mdico acerca de cunto debe dormir cada noche. Trate de dormir la cantidad de tiempo requerida todas las noches.  Tome los medicamentos solamente como se lo haya indicado el mdico.  Siga una dieta saludable y nutritiva. Pida ayuda al mdico si necesita hacer cambios en su dieta.  Beba suficiente lquido para Consulting civil engineer orina clara o de color amarillo plido.  Practique actividades que lo relajen, como yoga, meditacin, terapia de [place] o acupuntura.  Haga ejercicios regularmente.  Cambie las situaciones que le provocan estrs. Trate de que su [place] y personal sea moderada.  No consuma drogas.  Limite el consumo de alcohol a no ms de 1 medida por da si es mujer y no est Music therapist, y 2 medidas si es hombre. Una medida equivale a 12onzas de cerveza, 5onzas de vino o 1onzas de bebidas alcohlicas de alta graduacin.  Tome una multivitamina, si se lo indic el mdico. SOLICITE ATENCIN MDICA SI:  La fatiga no mejora.  Tiene fiebre.  Tiene prdida o aumento involuntario de [place].  Tiene dolores de [place].  Tiene dificultad para: ? Dormirse. ? Dormir durante toda la noche.  Se siente enojado, culpable, ansioso o triste.  No puede defecar (estreimiento).  Tiene la piel seca.  Tiene hinchadas las piernas u otra parte del cuerpo. SOLICITE ATENCIN  MDICA DE INMEDIATO SI:  Se siente confundido.  Tiene visin borrosa.  Sufre mareos o se desmaya.  Sufre un dolor intenso de [place].  Siente dolor intenso en el abdomen, la pelvis o la espalda.  Tiene dolor de pecho, dificultad para respirar, o latidos cardacos irregulares o acelerados.  No puede orinar u [employer] de lo normal.  Presenta sangrado anormal, como sangrado del recto, la vagina, la nariz, los pulmones o los pezones.  Vomita sangre.  Tiene pensamientos acerca de hacerse dao a s mismo o cometer suicidio.  Le preocupa la posibilidad de hacerle dao a otra persona. Esta informacin no tiene Marine scientist el consejo del mdico. Asegrese de hacerle al mdico cualquier pregunta que tenga. Document Released: 11/16/2008 Document Revised: 11/22/2015 Document Reviewed: 12/02/2013 Elsevier Interactive Patient Education  [employer].

## 2017-01-16 NOTE — Progress Notes (Signed)
Subjective:  Patient ID: Holly Miranda, female    DOB: [birth year]  Age: 41 y.o. MRN: [id number]  CC: abdominal pain  HPI Holly Miranda is a 41 y.o. female with a PMH of anemia presents with chronic generalized abdominal pain. She has recently been seen by GI and states that she was prescribed pantoprazole. Does not know if pantoprazole is helping. Also had upper endoscopy, colonoscopy, and labs over the past two months. TSH, CBC, CMP, and Lipase normal. Upper endoscopy revealed possible Barrett's esophagus and an erythematous stomach mucosa; biopsies taken. Colonoscopy revealed 83mm poly. Pathology reports from [facility] revealed H pylori in the antrum, GE junction gastritis without malignancy, and benign lymphoid polyp of the cecum. Patient does not endorse any other symptoms.    Outpatient Medications Prior to Visit  Medication Sig Dispense Refill  . linaclotide (LINZESS) 72 MCG capsule Take 72 mcg by mouth daily before breakfast. Samples given # 12  Lot number [id number] exp. 08/18  Pt to call and let us know how this is working for her.    . pantoprazole (PROTONIX) 40 MG tablet Take 1 tablet (40 mg total) by mouth every morning. 30 tablet 1   Facility-Administered Medications Prior to Visit  Medication Dose Route Frequency Provider Last Rate Last Dose  . 0.9 %  sodium chloride infusion  500 mL Intravenous Continuous Nandigam, Kavitha V, MD         ROS Review of Systems  Constitutional: Negative for chills, fever and malaise/fatigue.  Eyes: Negative for blurred vision.  Respiratory: Negative for shortness of breath.   Cardiovascular: Negative for chest pain and palpitations.  Gastrointestinal: Positive for abdominal pain. Negative for nausea.  Genitourinary: Negative for dysuria and hematuria.  Musculoskeletal: Negative for joint pain and myalgias.  Skin: Negative for rash.  Neurological: Negative for tingling and headaches.  Psychiatric/Behavioral: Negative for depression.  The patient is not nervous/anxious.     Objective:  BP 115/72 (BP Location: Left Arm, Patient Position: Sitting, Cuff Size: Normal)   Pulse 63   Temp 97.8 F (36.6 C) (Oral)   Ht 5\' 3"  (1.6 m)   Wt 146 lb 9.6 oz (66.5 kg)   LMP 01/11/2017 (Approximate)   SpO2 99%   BMI 25.97 kg/m   BP/Weight 01/16/2017 01/09/2017 4/68/0321  Systolic BP 224 825 003  Diastolic BP 72 67 76  Wt. (Lbs) 146.6 144 144  BMI 25.97 26.34 26.34      Physical Exam  Constitutional: She is oriented to person, place, and time.  Well developed, well nourished, NAD, polite  HENT:  Head: Normocephalic and atraumatic.  Eyes: No scleral icterus.  Neck: Normal range of motion. Neck supple. No thyromegaly present.  Cardiovascular: Normal rate, regular rhythm and normal heart sounds.   Pulmonary/Chest: Effort normal and breath sounds normal.  Abdominal: Soft. Bowel sounds are normal. There is tenderness (mild epigastric TTP).  Musculoskeletal: She exhibits no edema.  Neurological: She is alert and oriented to person, place, and time.  Skin: Skin is warm and dry. No rash noted. No erythema. No pallor.  Psychiatric: She has a normal mood and affect. Her behavior is normal. Thought content normal.  Vitals reviewed.    Assessment & Plan:   1. H pylori infection  - Begin Amoxicillin 500mg  tablet 2 tabs BID x14 days - Begin Clarithromycin 500 mg tablet 1 tab BID x14 days. - Increase Pantoprazole 40 mg to one tab BID x14 days. - Pt instructed to not take more than 14  days of Pantoprazole and to wait two weeks after last dose of Pantoprazole for her H pylori breath test.  2. Fatigue, unspecified type - Begin Cholecalciferol (VITAMIN D-3) 5000 units TABS; Take 1 tablet by mouth daily.  Dispense: 30 tablet; Refill: 0  3. Need for Tdap vaccination - Tdap vaccine greater than or equal to 7yo IM  4. Constipation - Begin Probiotic Product ([place]) CAPS; Take 1 capsule by mouth daily.  Dispense: 30  capsule; Refill: 2  Meds ordered this encounter  Medications  . Probiotic Product ([place]) CAPS    Sig: Take 1 capsule by mouth daily.    Dispense:  30 capsule    Refill:  2    Order Specific Question:   Supervising Provider    Answer:   Tresa Garter [id number]  . Cholecalciferol (VITAMIN D-3) 5000 units TABS    Sig: Take 1 tablet by mouth daily.    Dispense:  30 tablet    Refill:  0    Order Specific Question:   Supervising Provider    Answer:   Tresa Garter [id number]    Follow-up: Return in about 4 weeks (around 02/13/2017) for abdominal pain f/u.   Clent Demark PA

## 2017-01-17 ENCOUNTER — Encounter: Payer: Self-pay | Admitting: Gastroenterology

## 2017-01-26 ENCOUNTER — Ambulatory Visit (INDEPENDENT_AMBULATORY_CARE_PROVIDER_SITE_OTHER): Payer: Self-pay

## 2017-02-05 ENCOUNTER — Other Ambulatory Visit: Payer: Self-pay | Admitting: *Deleted

## 2017-02-05 DIAGNOSIS — A048 Other specified bacterial intestinal infections: Secondary | ICD-10-CM

## 2017-02-05 MED ORDER — PANTOPRAZOLE SODIUM 40 MG PO TBEC: DELAYED_RELEASE_TABLET | ORAL | 3 refills | Status: AC

## 2017-02-13 ENCOUNTER — Ambulatory Visit (INDEPENDENT_AMBULATORY_CARE_PROVIDER_SITE_OTHER): Payer: Self-pay | Admitting: Physician Assistant

## 2017-02-13 ENCOUNTER — Encounter (INDEPENDENT_AMBULATORY_CARE_PROVIDER_SITE_OTHER): Payer: Self-pay | Admitting: Physician Assistant

## 2017-02-13 VITALS — BP 108/66 | HR 84 | Temp 98.6°F | Resp 18 | Ht 65.0 in | Wt 143.0 lb

## 2017-02-13 DIAGNOSIS — A048 Other specified bacterial intestinal infections: Secondary | ICD-10-CM

## 2017-02-13 DIAGNOSIS — R1013 Epigastric pain: Secondary | ICD-10-CM

## 2017-02-13 DIAGNOSIS — K5900 Constipation, unspecified: Secondary | ICD-10-CM

## 2017-02-13 DIAGNOSIS — M542 Cervicalgia: Secondary | ICD-10-CM

## 2017-02-13 MED ORDER — CYCLOBENZAPRINE HCL 10 MG PO TABS: 10.0000 mg | ORAL_TABLET | Freq: Every day | ORAL | 1 refills | Status: DC

## 2017-02-13 NOTE — Patient Instructions (Signed)
Radiculopata cervical (Cervical Radiculopathy) La radiculopata cervical se presenta cuando un nervio del cuello (nervio cervical) est comprimido o daado. Esta afeccin puede producirse debido a una lesin o como parte del proceso de envejecimiento normal. La compresin de los nervios cervicales puede causar dolor o adormecimiento que se extiende desde el cuello hasta el brazo y los dedos de la [place]. Generalmente, esta afeccin mejora con reposo. Si no mejora, tal vez sea necesario administrar un tratamiento. CAUSAS Esta afeccin puede ser causada por lo siguiente:  Lesiones.  Desplazamiento de un disco (hernia).  Rigidez en el cuello debido al [employer].  Artritis.  Fractura o degeneracin de los huesos y las articulaciones de la columna (espondiloartrosis) debido al envejecimiento.  Espolones seos que pueden formarse cerca de los nervios cervicales. SNTOMAS Los sntomas de esta afeccin incluyen lo siguiente:  Dolor que se extiende desde el cuello hasta el brazo y [place]. El dolor puede ser intenso o molesto y empeorar al mover el cuello.  Adormecimiento o debilidad del brazo y Sales promotion account executive. DIAGNSTICO Esta afeccin se puede diagnosticar en funcin de los sntomas, la historia clnica y un examen fsico. Tambin pueden hacerle exmenes, que incluyen los siguientes:  Radiografas.  Tomografa computarizada.  Resonancia magntica.  Electromiograma (EMG).  Pruebas de conduccin nerviosa. TRATAMIENTO En muchos casos, no se requiere tratamiento para esta afeccin. Con reposo, esta suele mejorar con [employer]. Si es Arts development officer, las opciones pueden incluir lo siguiente:  Usar un collarn blando durante perodos breves.  Fisioterapia para fortalecer los msculos del cuello.  Medicamentos, como los antiinflamatorios no esteroides (AINE), los corticoides por va oral o las inyecciones en la columna vertebral.  [place]. Esta puede ser necesaria  si otros tratamientos no son eficaces. Se pueden realizar varios tipos de [place] en funcin de la causa de los [place]. INSTRUCCIONES PARA EL CUIDADO EN EL HOGAR Control del [employer] de venta libre y los recetados solamente como se lo haya indicado el mdico.  Si se lo indican, aplique hielo en la zona afectada. ? Ponga el hielo en una bolsa plstica. ? Coloque una [employer] piel y la bolsa de hielo. ? Coloque el hielo durante 105minutos, 2 a 3veces por da.  Si el hielo no le Ship broker, intente Multimedia programmer. Tome una ducha o un bao tibios, o colquese una compresa de calor como se lo haya indicado el mdico.  Intente darse un masaje suave en el cuello y el hombro para ayudar a Public house manager los sntomas. Actividad  Descanse todo lo que sea necesario. Siga las indicaciones del mdico respecto de las restricciones en las actividades.  Realice ejercicios de elongacin y fortalecimiento como se lo hayan indicado el mdico o el fisioterapeuta. Instrucciones generales  Si le indicaron que use un collarn, selo como se lo haya indicado el mdico.  Use una almohada plana para dormir.  Concurra a todas las visitas de control como se lo haya indicado el mdico. Esto es importante. SOLICITE ATENCIN MDICA SI:  La afeccin no mejora con tratamiento. SOLICITE ATENCIN MDICA DE INMEDIATO SI:  El dolor se intensifica y no se alivia con los [employer].  Siente debilidad o adormecimiento en la mano, el brazo, el rostro o la pierna.  Tiene fiebre alta.  Tiene rigidez de cuello.  Pierde el control de la vejiga o los intestinos (tiene incontinencia).  Tiene dificultad para caminar, mantener el equilibrio o hablar. Esta informacin no tiene Marine scientist el consejo  del mdico. Asegrese de hacerle al mdico cualquier pregunta que tenga. Document Released: 05/10/2005 Document Revised: 04/21/2015 Document Reviewed: 09/24/2014 Elsevier Interactive  Patient Education  [employer].

## 2017-02-13 NOTE — Progress Notes (Signed)
Subjective:  Patient ID: Holly Miranda, female    DOB: 03-May-1976  Age: 41 y.o. MRN: [id number]  CC: No chief complaint on file.   HPI Holly Miranda is a 41 y.o. female with a PMH of anemia presents to f/u on epigastric pain 2/2 H pylori infection. Patient states she felt well when she was taking the abx for H pylori infection. However, she has recurrence of epigastric pain since yesterday. Described as "same pain as before". She is currently still taking PPI and therefore not ready for H pylori breath test. Denies hematochezia, melena, hematemesis, f/c/n/v, chest pain, SOB, or HA.     Outpatient Medications Prior to Visit  Medication Sig Dispense Refill  . Cholecalciferol (VITAMIN D-3) 5000 units TABS Take 1 tablet by mouth daily. 30 tablet 0  . pantoprazole (PROTONIX) 40 MG tablet Take 1 tablet by mouth every morning. 90 tablet 3  . Probiotic Product ([place]) CAPS Take 1 capsule by mouth daily. 30 capsule 2  . linaclotide (LINZESS) 72 MCG capsule Take 72 mcg by mouth daily before breakfast. Samples given # 12  Lot number [id number] exp. 08/18  Pt to call and let us know how this is working for her.    Marland Kitchen 0.9 %  sodium chloride infusion      No facility-administered medications prior to visit.      ROS Review of Systems  Constitutional: Negative for chills, fever and malaise/fatigue.  Eyes: Negative for blurred vision.  Respiratory: Negative for shortness of breath.   Cardiovascular: Negative for chest pain and palpitations.  Gastrointestinal: Positive for abdominal pain. Negative for nausea.  Genitourinary: Negative for dysuria and hematuria.  Musculoskeletal: Negative for joint pain and myalgias.  Skin: Negative for rash.  Neurological: Negative for tingling and headaches.  Psychiatric/Behavioral: Negative for depression. The patient is not nervous/anxious.     Objective:  BP 108/66 (BP Location: Left Arm, Patient Position: Sitting, Cuff Size: Normal)   Pulse  84   Temp 98.6 F (37 C) (Oral)   Resp 18   Ht 5\' 5"  (1.651 m)   Wt 143 lb (64.9 kg)   SpO2 100%   BMI 23.80 kg/m   BP/Weight 02/13/2017 01/16/2017 5/36/6440  Systolic BP 347 425 956  Diastolic BP 66 72 67  Wt. (Lbs) 143 146.6 144  BMI 23.8 25.97 26.34      Physical Exam  Constitutional: She is oriented to person, place, and time.  Well developed, overweight, NAD, polite  HENT:  Head: Normocephalic and atraumatic.  Eyes: No scleral icterus.  Cardiovascular: Normal rate, regular rhythm and normal heart sounds.   Pulmonary/Chest: Effort normal and breath sounds normal.  Abdominal: Soft. Bowel sounds are normal. There is no tenderness.  Musculoskeletal: She exhibits no edema.  Neurological: She is alert and oriented to person, place, and time. No cranial nerve deficit. Coordination normal.  Skin: Skin is warm and dry. No rash noted. No erythema. No pallor.  Psychiatric: She has a normal mood and affect. Her behavior is normal. Thought content normal.  Vitals reviewed.    Assessment & Plan:   1. Helicobacter pylori infection - H. pylori breath test. Patient not ready to do breath test today due to current use of PPI. I have instructed patient to stop PPI and return in two weeks for breath test.  2. Constipation, unspecified constipation type - Doing better on Probiotics. Failed Linzess.  3. Abdominal pain, epigastric - possible abx failure for H pylori? Previous lipase and  CMP normal at hospital. Will await H pylori breath test before further testing especially due to financial constraints.  4. Cervicalgia -  Begin cyclobenzaprine (FLEXERIL) 10 MG tablet; Take 1 tablet (10 mg total) by mouth at bedtime.  Dispense: 14 tablet; Refill: 1 - Advised not to take NSAIDs at this point due to H pylori infection. - DG Cervical Spine Complete; Future - DG Thoracic Spine 4V; Future   Meds ordered this encounter  Medications  . cyclobenzaprine (FLEXERIL) 10 MG tablet    Sig: Take  1 tablet (10 mg total) by mouth at bedtime.    Dispense:  14 tablet    Refill:  1    Order Specific Question:   Supervising Provider    Answer:   Tresa Garter [[id number]]    Follow-up: Return in about 2 weeks (around 02/27/2017) for cervicalgia and H pylori breath test.   Clent Demark PA

## 2017-02-19 ENCOUNTER — Other Ambulatory Visit (INDEPENDENT_AMBULATORY_CARE_PROVIDER_SITE_OTHER): Payer: Self-pay | Admitting: Physician Assistant

## 2017-02-19 ENCOUNTER — Ambulatory Visit (HOSPITAL_COMMUNITY)
Admission: RE | Admit: 2017-02-19 | Discharge: 2017-02-19 | Disposition: A | Discharge status: Home / Self Care | Payer: Self-pay | Source: Ambulatory Visit | Attending: Physician Assistant | Admitting: Physician Assistant

## 2017-02-19 DIAGNOSIS — M4602 Spinal enthesopathy, cervical region: Secondary | ICD-10-CM | POA: Insufficient documentation

## 2017-02-19 DIAGNOSIS — G8929 Other chronic pain: Secondary | ICD-10-CM | POA: Insufficient documentation

## 2017-02-19 DIAGNOSIS — M47894 Other spondylosis, thoracic region: Secondary | ICD-10-CM | POA: Insufficient documentation

## 2017-02-19 DIAGNOSIS — M4802 Spinal stenosis, cervical region: Secondary | ICD-10-CM | POA: Insufficient documentation

## 2017-02-19 DIAGNOSIS — M8938 Hypertrophy of bone, other site: Secondary | ICD-10-CM | POA: Insufficient documentation

## 2017-02-19 DIAGNOSIS — M438X4 Other specified deforming dorsopathies, thoracic region: Secondary | ICD-10-CM | POA: Insufficient documentation

## 2017-02-19 DIAGNOSIS — M542 Cervicalgia: Secondary | ICD-10-CM

## 2017-02-22 ENCOUNTER — Telehealth (INDEPENDENT_AMBULATORY_CARE_PROVIDER_SITE_OTHER): Payer: Self-pay | Admitting: Physician Assistant

## 2017-02-22 ENCOUNTER — Telehealth (INDEPENDENT_AMBULATORY_CARE_PROVIDER_SITE_OTHER): Payer: Self-pay

## 2017-02-22 NOTE — Telephone Encounter (Signed)
-----   Message from Clent Demark, PA-C sent at 02/21/2017 10:11 AM EDT ----- I have tried calling mobile but voicemail box not set up yet. Home number tried twice but there is only a busy signal. Please try to contact patient and tell her the following or give me the phone to explain the following:    T spine- Minimal degenerative changes T2-3 through T5-6. Minimal curvature thoracic spine.   C spine- Mild C5-6 disc space narrowing with spur. Uncinate hypertrophy with very mild bilateral C5-6 foraminal narrowing.

## 2017-02-22 NOTE — Telephone Encounter (Signed)
Patient called requesting x-ray results, please follow up with patient.

## 2017-02-22 NOTE — Telephone Encounter (Signed)
Called both numbers on file for patient, cell phone has voicemail that has not been set up yet and home phone just gives a busy signal. Nat Christen, CMA

## 2017-02-23 NOTE — Telephone Encounter (Signed)
FWD to PCP. Tempestt S Roberts, CMA  

## 2017-02-23 NOTE — Telephone Encounter (Signed)
I spoke to patient and advised that she take NSAID and continue with her physical therapy that she has in place already.

## 2017-03-07 ENCOUNTER — Ambulatory Visit (INDEPENDENT_AMBULATORY_CARE_PROVIDER_SITE_OTHER): Payer: Self-pay | Admitting: Physician Assistant

## 2017-03-07 ENCOUNTER — Encounter (INDEPENDENT_AMBULATORY_CARE_PROVIDER_SITE_OTHER): Payer: Self-pay | Admitting: Physician Assistant

## 2017-03-07 VITALS — BP 114/75 | HR 78 | Temp 98.0°F | Wt 147.2 lb

## 2017-03-07 DIAGNOSIS — A048 Other specified bacterial intestinal infections: Secondary | ICD-10-CM

## 2017-03-07 DIAGNOSIS — M542 Cervicalgia: Secondary | ICD-10-CM

## 2017-03-07 MED ORDER — NAPROXEN 500 MG PO TABS: 500.0000 mg | ORAL_TABLET | Freq: Two times a day (BID) | ORAL | 0 refills | Status: DC

## 2017-03-07 NOTE — Patient Instructions (Signed)
Radiculopata cervical (Cervical Radiculopathy) La radiculopata cervical se presenta cuando un nervio del cuello (nervio cervical) est comprimido o daado. Esta afeccin puede producirse debido a una lesin o como parte del proceso de envejecimiento normal. La compresin de los nervios cervicales puede causar dolor o adormecimiento que se extiende desde el cuello hasta el brazo y los dedos de la [place]. Generalmente, esta afeccin mejora con reposo. Si no mejora, tal vez sea necesario administrar un tratamiento. CAUSAS Esta afeccin puede ser causada por lo siguiente:  Lesiones.  Desplazamiento de un disco (hernia).  Rigidez en el cuello debido al [employer].  Artritis.  Fractura o degeneracin de los huesos y las articulaciones de la columna (espondiloartrosis) debido al envejecimiento.  Espolones seos que pueden formarse cerca de los nervios cervicales. SNTOMAS Los sntomas de esta afeccin incluyen lo siguiente:  Dolor que se extiende desde el cuello hasta el brazo y [place]. El dolor puede ser intenso o molesto y empeorar al mover el cuello.  Adormecimiento o debilidad del brazo y Sales promotion account executive. DIAGNSTICO Esta afeccin se puede diagnosticar en funcin de los sntomas, la historia clnica y un examen fsico. Tambin pueden hacerle exmenes, que incluyen los siguientes:  Radiografas.  Tomografa computarizada.  Resonancia magntica.  Electromiograma (EMG).  Pruebas de conduccin nerviosa. TRATAMIENTO En muchos casos, no se requiere tratamiento para esta afeccin. Con reposo, esta suele mejorar con [employer]. Si es Arts development officer, las opciones pueden incluir lo siguiente:  Usar un collarn blando durante perodos breves.  Fisioterapia para fortalecer los msculos del cuello.  Medicamentos, como los antiinflamatorios no esteroides (AINE), los corticoides por va oral o las inyecciones en la columna vertebral.  Ciruga. Esta puede ser necesaria  si otros tratamientos no son eficaces. Se pueden realizar varios tipos de [place] en funcin de la causa de los [place]. INSTRUCCIONES PARA EL CUIDADO EN EL HOGAR Control del [employer] de venta libre y los recetados solamente como se lo haya indicado el mdico.  Si se lo indican, aplique hielo en la zona afectada. ? Ponga el hielo en una bolsa plstica. ? Coloque una [employer] piel y la bolsa de hielo. ? Coloque el hielo durante 45minutos, 2 a 3veces por da.  Si el hielo no le Ship broker, intente Multimedia programmer. Tome una ducha o un bao tibios, o colquese una compresa de calor como se lo haya indicado el mdico.  Intente darse un masaje suave en el cuello y el hombro para ayudar a Public house manager los sntomas. Actividad  Descanse todo lo que sea necesario. Siga las indicaciones del mdico respecto de las restricciones en las actividades.  Realice ejercicios de elongacin y fortalecimiento como se lo hayan indicado el mdico o el fisioterapeuta. Instrucciones generales  Si le indicaron que use un collarn, selo como se lo haya indicado el mdico.  Use una almohada plana para dormir.  Concurra a todas las visitas de control como se lo haya indicado el mdico. Esto es importante. SOLICITE ATENCIN MDICA SI:  La afeccin no mejora con tratamiento. SOLICITE ATENCIN MDICA DE INMEDIATO SI:  El dolor se intensifica y no se alivia con los [employer].  Siente debilidad o adormecimiento en la mano, el brazo, el rostro o la pierna.  Tiene fiebre alta.  Tiene rigidez de cuello.  Pierde el control de la vejiga o los intestinos (tiene incontinencia).  Tiene dificultad para caminar, mantener el equilibrio o hablar. Esta informacin no tiene Marine scientist el consejo  del mdico. Asegrese de hacerle al mdico cualquier pregunta que tenga. Document Released: 05/10/2005 Document Revised: 04/21/2015 Document Reviewed: 09/24/2014 Elsevier Interactive  Patient Education  [employer].

## 2017-03-07 NOTE — Progress Notes (Signed)
Subjective:  Patient ID: Holly Miranda, female    DOB: 04-26-1976  Age: 41 y.o. MRN: [id number]  CC: cervicalgia   HPI Holly Miranda is a 42 y.o. female presents to follow up on cervicalgia. XR of c spine has found mild C5-6 disc space narrowing with spur. Uncinate hypertrophy with very mild bilateral C5-6 foraminal narrowing. Has been going to the chiropractor for approximately 8-9 months with temporary relief. Has not been to an orthopedic specialist. Is not currently taking NSAIDs.     Patient was found to have H pylori infection and was recently treated. Says she is now ready for the H pylori breath test. Minimal epigastric symptoms but much better after treatment.   Outpatient Medications Prior to Visit  Medication Sig Dispense Refill  . Cholecalciferol (VITAMIN D-3) 5000 units TABS Take 1 tablet by mouth daily. 30 tablet 0  . cyclobenzaprine (FLEXERIL) 10 MG tablet Take 1 tablet (10 mg total) by mouth at bedtime. 14 tablet 1  . pantoprazole (PROTONIX) 40 MG tablet Take 1 tablet by mouth every morning. 90 tablet 3  . Probiotic Product ([place]) CAPS Take 1 capsule by mouth daily. 30 capsule 2   No facility-administered medications prior to visit.      ROS Review of Systems  Constitutional: Negative for chills, fever and malaise/fatigue.  Eyes: Negative for blurred vision.  Respiratory: Negative for shortness of breath.   Cardiovascular: Negative for chest pain and palpitations.  Gastrointestinal: Negative for abdominal pain and nausea.  Genitourinary: Negative for dysuria and hematuria.  Musculoskeletal: Positive for neck pain. Negative for joint pain and myalgias.  Skin: Negative for rash.  Neurological: Negative for tingling and headaches.  Psychiatric/Behavioral: Negative for depression. The patient is not nervous/anxious.     Objective:  BP 114/75 (BP Location: Left Arm, Patient Position: Sitting, Cuff Size: Normal)   Pulse 78   Temp 98 F (36.7 C)  (Oral)   Wt 147 lb 3.2 oz (66.8 kg)   LMP 03/02/2017 (Exact Date)   SpO2 98%   BMI 24.50 kg/m   BP/Weight 03/07/2017 09/20/2534 01/15/4033  Systolic BP 742 595 638  Diastolic BP 75 66 72  Wt. (Lbs) 147.2 143 146.6  BMI 24.5 23.8 25.97      Physical Exam  Constitutional: She is oriented to person, place, and time.  Well developed, well nourished, NAD, polite  HENT:  Head: Normocephalic and atraumatic.  Eyes: Pupils are equal, round, and reactive to light.  Cardiovascular: Normal rate, regular rhythm and normal heart sounds.   Pulmonary/Chest: Effort normal and breath sounds normal.  Musculoskeletal: She exhibits no edema.  UEs with full aROM.  Neurological: She is alert and oriented to person, place, and time. No cranial nerve deficit. Coordination normal.  Hand grip strength 5/5 bilaterally.  Skin: Skin is warm and dry. No rash noted. No erythema. No pallor.  Psychiatric: She has a normal mood and affect. Her behavior is normal. Thought content normal.  Vitals reviewed.    Assessment & Plan:   1. Cervicalgia - Ambulatory referral to Orthopedic Surgery - Begin naproxen (NAPROSYN) 500 MG tablet; Take 1 tablet (500 mg total) by mouth 2 (two) times daily with a meal.  Dispense: 30 tablet; Refill: 0  2. H pylori infection - H pylori breath test   Meds ordered this encounter  Medications  . naproxen (NAPROSYN) 500 MG tablet    Sig: Take 1 tablet (500 mg total) by mouth 2 (two) times daily with a meal.  Dispense:  30 tablet    Refill:  0    Order Specific Question:   Supervising Provider    Answer:   Tresa Garter [id number]    Follow-up: Return if symptoms worsen or fail to improve.   Clent Demark PA      Follow-up: PRN  Clent Demark PA

## 2017-03-10 LAB — H.PYLORI BREATH TEST (REFLEX): H. PYLORI BREATH TEST: NEGATIVE

## 2017-03-10 LAB — H. PYLORI BREATH TEST

## 2017-03-13 ENCOUNTER — Encounter (INDEPENDENT_AMBULATORY_CARE_PROVIDER_SITE_OTHER): Payer: Self-pay

## 2017-03-14 ENCOUNTER — Telehealth (INDEPENDENT_AMBULATORY_CARE_PROVIDER_SITE_OTHER): Payer: Self-pay | Admitting: Physician Assistant

## 2017-03-14 NOTE — Telephone Encounter (Signed)
Patient called to request lab results. Please follow up with a spanish interpreter.

## 2017-03-22 ENCOUNTER — Telehealth (INDEPENDENT_AMBULATORY_CARE_PROVIDER_SITE_OTHER): Payer: Self-pay | Admitting: Physician Assistant

## 2017-03-22 NOTE — Telephone Encounter (Signed)
Patient called to request lab results. Please follow up with a spanish interpreter.

## 2017-03-22 NOTE — Telephone Encounter (Signed)
Patient was notify about her lab results  Thank You

## 2017-04-03 ENCOUNTER — Encounter (INDEPENDENT_AMBULATORY_CARE_PROVIDER_SITE_OTHER): Payer: Self-pay | Admitting: Physician Assistant

## 2017-04-03 ENCOUNTER — Ambulatory Visit (INDEPENDENT_AMBULATORY_CARE_PROVIDER_SITE_OTHER): Payer: Self-pay | Admitting: Physician Assistant

## 2017-04-03 VITALS — BP 92/58 | HR 66 | Temp 97.9°F | Resp 18 | Ht 62.0 in | Wt 148.0 lb

## 2017-04-03 DIAGNOSIS — R109 Unspecified abdominal pain: Secondary | ICD-10-CM

## 2017-04-03 DIAGNOSIS — K5900 Constipation, unspecified: Secondary | ICD-10-CM

## 2017-04-03 DIAGNOSIS — R631 Polydipsia: Secondary | ICD-10-CM

## 2017-04-03 DIAGNOSIS — R6881 Early satiety: Secondary | ICD-10-CM

## 2017-04-03 LAB — POCT GLYCOSYLATED HEMOGLOBIN (HGB A1C): Hemoglobin A1C: 5.3

## 2017-04-03 MED ORDER — METOCLOPRAMIDE HCL 5 MG PO TABS: 5.0000 mg | ORAL_TABLET | Freq: Two times a day (BID) | ORAL | 0 refills | Status: DC

## 2017-04-03 MED ORDER — LINACLOTIDE 290 MCG PO CAPS: 290.0000 ug | ORAL_CAPSULE | Freq: Every day | ORAL | 0 refills | Status: DC

## 2017-04-03 MED FILL — METOCLOPRAMIDE 5 MG TABLET: 5 | 30 days supply | Qty: 60 | Fill #0

## 2017-04-03 NOTE — Progress Notes (Signed)
Subjective:  Patient ID: Holly Miranda, female    DOB: 1976/03/14  Age: 41 y.o. MRN: [id number]  CC: abdominal cramps  HPI Holly Miranda is a 41 y.o. female with a medical history of H pylori infection and diverticula presents on f/u of abdominal pain. She took three drug treatment for eradication of H pylori. H pylori breath test was negative. She reports feeling no "typical" epigastric pain as of approximately one week ago. However, pain continues to be generalized in the abdomen. Also continues with constipation and has failed Amitiza 24 mcg BID, Linzess 75 mg, and OTC constipation products. Feels as if food is "stuck" in the stomach and has early satiety as a result. Does not report any other associated symptom or complaint.        Outpatient Medications Prior to Visit  Medication Sig Dispense Refill  . Cholecalciferol (VITAMIN D-3) 5000 units TABS Take 1 tablet by mouth daily. 30 tablet 0  . cyclobenzaprine (FLEXERIL) 10 MG tablet Take 1 tablet (10 mg total) by mouth at bedtime. 14 tablet 1  . pantoprazole (PROTONIX) 40 MG tablet Take 1 tablet by mouth every morning. 90 tablet 3  . Probiotic Product ([place]) CAPS Take 1 capsule by mouth daily. 30 capsule 2  . naproxen (NAPROSYN) 500 MG tablet Take 1 tablet (500 mg total) by mouth 2 (two) times daily with a meal. (Patient not taking: Reported on 04/03/2017) 30 tablet 0   No facility-administered medications prior to visit.      ROS Review of Systems  Constitutional: Negative for chills, fever and malaise/fatigue.  Eyes: Negative for blurred vision.  Respiratory: Negative for shortness of breath.   Cardiovascular: Negative for chest pain and palpitations.  Gastrointestinal: Positive for abdominal pain and constipation. Negative for blood in stool, diarrhea, heartburn, melena, nausea and vomiting.  Genitourinary: Negative for dysuria and hematuria.  Musculoskeletal: Negative for joint pain and myalgias.  Skin:  Negative for rash.  Neurological: Negative for tingling and headaches.  Psychiatric/Behavioral: Negative for depression. The patient is not nervous/anxious.     Objective:  BP (!) 92/58 (BP Location: Left Arm, Patient Position: Sitting, Cuff Size: Normal)   Pulse 66   Temp 97.9 F (36.6 C) (Oral)   Resp 18   Ht 5\' 2"  (1.575 m)   Wt 148 lb (67.1 kg)   SpO2 97%   BMI 27.07 kg/m   BP/Weight 04/03/2017 8/82/8003 11/20/1789  Systolic BP 92 505 697  Diastolic BP 58 75 66  Wt. (Lbs) 148 147.2 143  BMI 27.07 24.5 23.8      Physical Exam  Constitutional: She is oriented to person, place, and time.  Well developed, well nourished, NAD, polite  HENT:  Head: Normocephalic and atraumatic.  Eyes: No scleral icterus.  Neck: Normal range of motion. Neck supple. No thyromegaly present.  Cardiovascular: Normal rate, regular rhythm and normal heart sounds.   Pulmonary/Chest: Effort normal and breath sounds normal.  Abdominal: Soft. Bowel sounds are normal. She exhibits no distension and no mass. There is tenderness (mild generalized TTP ). There is no rebound and no guarding.  Hypoactive bowel sounds  Musculoskeletal: She exhibits no edema.  Neurological: She is alert and oriented to person, place, and time. A cranial nerve deficit (strabismus) is present.  Skin: Skin is warm and dry. No rash noted. No erythema. No pallor.  Psychiatric: She has a normal mood and affect. Her behavior is normal. Thought content normal.  Vitals reviewed.  Assessment & Plan:    1. Abdominal pain, unspecified abdominal location - CT Abdomen Pelvis W Contrast; Future. To assess for occult malignancy.  2. Constipation, unspecified constipation type - Begin linaclotide (LINZESS) 290 MCG CAPS capsule; Take 1 capsule (290 mcg total) by mouth daily before breakfast.  Dispense: 30 capsule; Refill: 0  3. Early satiety - Begin metoCLOPramide (REGLAN) 5 MG tablet; Take 1 tablet (5 mg total) by mouth 2 (two) times  daily.  Dispense: 60 tablet; Refill: 0  4. Polydipsia - HgB A1c 5.3% in clinic today.   Meds ordered this encounter  Medications  . DISCONTD: linaclotide (LINZESS) 290 MCG CAPS capsule    Sig: Take 1 capsule (290 mcg total) by mouth daily before breakfast.    Dispense:  30 capsule    Refill:  0    Sample only please. Do not dispense if no sample can be given.    Order Specific Question:   Supervising Provider    Answer:   Tresa Garter [id number]  . DISCONTD: metoCLOPramide (REGLAN) 5 MG tablet    Sig: Take 1 tablet (5 mg total) by mouth 2 (two) times daily.    Dispense:  60 tablet    Refill:  0    Order Specific Question:   Supervising Provider    Answer:   Tresa Garter [id number]  . metoCLOPramide (REGLAN) 5 MG tablet    Sig: Take 1 tablet (5 mg total) by mouth 2 (two) times daily.    Dispense:  60 tablet    Refill:  0    Order Specific Question:   Supervising Provider    Answer:   Tresa Garter [id number]  . linaclotide (LINZESS) 290 MCG CAPS capsule    Sig: Take 1 capsule (290 mcg total) by mouth daily before breakfast.    Dispense:  30 capsule    Refill:  0    Sample only please. Do not dispense if no sample can be given.    Order Specific Question:   Supervising Provider    Answer:   Tresa Garter [[id number]]    Follow-up: Return in about 4 weeks (around 05/01/2017) for constipation.   Clent Demark PA

## 2017-04-03 NOTE — Patient Instructions (Signed)
Constipacin en los adultos (Constipation, Adult) Constipacin significa que una persona defeca en una semana menos que lo normal, hay dificultad para defecar, o las heces son secas, duras, o ms grandes que lo normal. La causa puede ser una afeccin subyacente. Puede empeorar con la edad si una persona toma ciertos medicamentos y no toma suficiente lquido. INSTRUCCIONES PARA EL CUIDADO EN EL HOGAR Comida y bebida  Consuma alimentos con alto contenido de [place], como frutas y verduras frescas, cereales integrales y frijoles.  Limite los alimentos ricos en grasas y con bajo contenido de fibra, o muy procesados, como las papas fritas, [place], [place], dulces y refrescos.  Beba suficiente lquido para Consulting civil engineer orina clara o de color amarillo plido. Instrucciones generales  Haga actividad fsica habitualmente o como se lo haya indicado el mdico.  Vaya al bao cuando sienta la necesidad de ir. No se aguante las ganas.  Tome los medicamentos de venta libre y los recetados solamente como se lo haya indicado el mdico. Estos incluyen los suplementos de [place].  Practique ejercicios de rehabilitacin del suelo plvico, como la respiracin profunda mientras relaja la parte inferior del abdomen y relajacin del suelo plvico mientras defeca.  Controle su afeccin para ver si hay cambios.  Concurra a todas las visitas de control como se lo haya indicado el mdico. Esto es importante. SOLICITE ATENCIN MDICA SI:  Siente un dolor que empeora.  Tiene fiebre.  No defeca despus de 4das.  Vomita.  No tiene hambre.  Pierde peso.  Tiene una hemorragia en el ano.  Las heces son delgadas como un lpiz.  SOLICITE ATENCIN MDICA DE INMEDIATO SI:  Tiene fiebre y los sntomas empeoran repentinamente.  Observa que se filtran heces o hay sangre en las heces.  Tiene el abdomen hinchado.  Siente un dolor intenso en el abdomen.  Se siente mareado o se desmaya.  Esta informacin  no tiene Marine scientist el consejo del mdico. Asegrese de hacerle al mdico cualquier pregunta que tenga. Document Released: 08/20/2007 Document Revised: 08/21/2014 Document Reviewed: 01/19/2016 Elsevier Interactive Patient Education  2017 [employer].

## 2017-04-09 ENCOUNTER — Encounter (INDEPENDENT_AMBULATORY_CARE_PROVIDER_SITE_OTHER): Payer: Self-pay | Admitting: Orthopaedic Surgery

## 2017-04-09 ENCOUNTER — Ambulatory Visit (INDEPENDENT_AMBULATORY_CARE_PROVIDER_SITE_OTHER): Payer: Self-pay | Admitting: Orthopaedic Surgery

## 2017-04-09 DIAGNOSIS — M5412 Radiculopathy, cervical region: Secondary | ICD-10-CM

## 2017-04-09 NOTE — Progress Notes (Signed)
   Office Visit Note   Patient: Holly Miranda           Date of Birth: 05-28-1976           MRN: [id number] Visit Date: 04/09/2017              Requested by: Clent Demark, PA-C [place], [place] [address] PCP: Clent Demark, PA-C   Assessment & Plan: Visit Diagnoses:  1. Cervical radiculopathy     Plan: Recommend MRI of the cervical spine to rule out structural abnormalities and bilateral carpal tunnel nerve studies. Follow-up after the studies.  Follow-Up Instructions: Return in about 2 weeks (around 04/23/2017).   Orders:  Orders Placed This Encounter  Procedures  . MR Cervical Spine w/o contrast  . Ambulatory referral to Physical Medicine Rehab   No orders of the defined types were placed in this encounter.     Procedures: No procedures performed   Clinical Data: No additional findings.   Subjective: Chief Complaint  Patient presents with  . Neck - Pain    Patient is a 41 year old female comes with 2 year history of neck pain with radiation down into her shoulders and mid back and bilateral hands. She also feels some numbness in her fingers. She endorses numbness and tingling and burning. Denies any injuries. She cleans houses for a living.    Review of Systems  Constitutional: Negative.   HENT: Negative.   Eyes: Negative.   Respiratory: Negative.   Cardiovascular: Negative.   Endocrine: Negative.   Musculoskeletal: Negative.   Neurological: Negative.   Hematological: Negative.   Psychiatric/Behavioral: Negative.   All other systems reviewed and are negative.    Objective: Vital Signs: There were no vitals taken for this visit.  Physical Exam  Constitutional: She is oriented to person, place, and time. She appears well-developed and well-nourished.  HENT:  Head: Normocephalic and atraumatic.  Eyes: EOM are normal.  Neck: Neck supple.  Pulmonary/Chest: Effort normal.  Abdominal: Soft.  Neurological: She is alert and  oriented to person, place, and time.  Skin: Skin is warm. Capillary refill takes less than 2 seconds.  Psychiatric: She has a normal mood and affect. Her behavior is normal. Judgment and thought content normal.  Nursing note and vitals reviewed.   Ortho Exam Bilateral hand exam shows mildly positive carpal tunnel compressive signs. Negative Spurling's. No focal motor sensory deficits. Normal reflexes. Specialty Comments:  No specialty comments available.  Imaging: No results found.   PMFS History: Patient Active Problem List   Diagnosis Date Noted  . Cervical radiculopathy 04/09/2017  . PLANTAR FASCIITIS, LEFT 12/20/2006  . FOOT PAIN, BILATERAL 12/20/2006   Past Medical History:  Diagnosis Date  . Anemia     Family History  Problem Relation Age of Onset  . Colitis Mother     No past surgical history on file. Social History   Occupational History  . Not on file.   Social History Main Topics  . Smoking status: Never Smoker  . Smokeless tobacco: Never Used  . Alcohol use No  . Drug use: No  . Sexual activity: Not on file

## 2017-04-23 ENCOUNTER — Ambulatory Visit (INDEPENDENT_AMBULATORY_CARE_PROVIDER_SITE_OTHER): Payer: Self-pay | Admitting: Orthopaedic Surgery

## 2017-04-23 ENCOUNTER — Encounter (INDEPENDENT_AMBULATORY_CARE_PROVIDER_SITE_OTHER): Payer: Self-pay

## 2017-04-23 DIAGNOSIS — M5412 Radiculopathy, cervical region: Secondary | ICD-10-CM

## 2017-04-23 NOTE — Progress Notes (Signed)
Reschedule

## 2017-04-26 ENCOUNTER — Ambulatory Visit (INDEPENDENT_AMBULATORY_CARE_PROVIDER_SITE_OTHER): Payer: Self-pay | Admitting: Physical Medicine and Rehabilitation

## 2017-04-26 ENCOUNTER — Encounter (INDEPENDENT_AMBULATORY_CARE_PROVIDER_SITE_OTHER): Payer: Self-pay | Admitting: Physical Medicine and Rehabilitation

## 2017-04-26 DIAGNOSIS — R202 Paresthesia of skin: Secondary | ICD-10-CM

## 2017-04-26 NOTE — Progress Notes (Deleted)
Neck and upper back pain. Also has hand pain. Weakness in both hands. Left side is worse. Right hand dominant. When using hands a lot first and fifth fingers are worse.

## 2017-04-30 NOTE — Procedures (Signed)
EMG & NCV Findings: All nerve conduction studies (as indicated in the following tables) were within normal limits.  All left vs. right side differences were within normal limits.    All examined muscles (as indicated in the following table) showed no evidence of electrical instability.    Impression: Essentially NORMAL electrodiagnostic study of both upper limbs.  There is no significant electrodiagnostic evidence of nerve entrapment, brachial plexopathy, cervical radiculopathy or generalized peripheral neuropathy.    As you know, purely sensory or demyelinating radiculopathies and chemical radiculitis may not be detected with this particular electrodiagnostic study.  This electrodiagnostic study cannot rule out small fiber polyneuropathy and dysesthesias from central pain sensitization syndromes such as fibromyalgia.  Recommendations: 1.  Follow-up with referring physician. 2.  Continue current management of symptoms.   Nerve Conduction Studies Anti Sensory Summary Table   Stim Site NR Peak (ms) Norm Peak (ms) P-T Amp (V) Norm P-T Amp Site1 Site2 Delta-P (ms) Dist (cm) Vel (m/s) Norm Vel (m/s)  Left Median Acr Palm Anti Sensory (2nd Digit)  31C  Wrist    3.3 <3.6 75.1 >10 Wrist Palm 1.5 0.0    Palm    1.8 <2.0 87.3         Right Median Acr Palm Anti Sensory (2nd Digit)  30.4C  Wrist    3.4 <3.6 57.7 >10 Wrist Palm 1.6 0.0    Palm    1.8 <2.0 67.7         Left Radial Anti Sensory (Base 1st Digit)  31.2C  Wrist    2.1 <3.1 37.6  Wrist Base 1st Digit 2.1 0.0    Right Radial Anti Sensory (Base 1st Digit)  31.4C  Wrist    2.1 <3.1 30.5  Wrist Base 1st Digit 2.1 0.0    Left Ulnar Anti Sensory (5th Digit)  31.2C  Wrist    3.2 <3.7 79.4 >15.0 Wrist 5th Digit 3.2 14.0 44 >38  Right Ulnar Anti Sensory (5th Digit)  30.8C  Wrist    3.3 <3.7 68.8 >15.0 Wrist 5th Digit 3.3 14.0 42 >38   Motor Summary Table   Stim Site NR Onset (ms) Norm Onset (ms) O-P Amp (mV) Norm O-P Amp Site1 Site2  Delta-0 (ms) Dist (cm) Vel (m/s) Norm Vel (m/s)  Left Median Motor (Abd Poll Brev)  31.1C  Wrist    3.4 <4.2 8.2 >5 Elbow Wrist 4.0 21.0 53 >50  Elbow    7.4  5.1         Right Median Motor (Abd Poll Brev)  31.6C  Wrist    3.8 <4.2 8.3 >5 Elbow Wrist 3.9 20.0 51 >50  Elbow    7.7  8.4         Left Ulnar Motor (Abd Dig Min)  31.2C  Wrist    2.9 <4.2 10.3 >3 B Elbow Wrist 3.2 18.5 58 >53  B Elbow    6.1  9.4  A Elbow B Elbow 1.1 9.5 86 >53  A Elbow    7.2  8.4         Right Ulnar Motor (Abd Dig Min)  31.7C  Wrist    3.0 <4.2 12.9 >3 B Elbow Wrist 3.2 19.5 61 >53  B Elbow    6.2  12.6  A Elbow B Elbow 1.0 10.0 100 >53  A Elbow    7.2  12.2          EMG   Side Muscle Nerve Root Ins Act Fibs Psw Amp  Dur Poly Recrt Int Fraser Din Comment  Left 1stDorInt Ulnar C8-T1 Nml Nml Nml Nml Nml 0 Nml Nml   Left Abd Poll Brev Median C8-T1 Nml Nml Nml Nml Nml 0 Nml Nml   Left ExtDigCom   Nml Nml Nml Nml Nml 0 Nml Nml   Left Triceps Radial C6-7-8 Nml Nml Nml Nml Nml 0 Nml Nml   Left Deltoid Axillary C5-6 Nml Nml Nml Nml Nml 0 Nml Nml     Nerve Conduction Studies Anti Sensory Left/Right Comparison   Stim Site L Lat (ms) R Lat (ms) L-R Lat (ms) L Amp (V) R Amp (V) L-R Amp (%) Site1 Site2 L Vel (m/s) R Vel (m/s) L-R Vel (m/s)  Median Acr Palm Anti Sensory (2nd Digit)  31C  Wrist 3.3 3.4 0.1 75.1 57.7 23.2 Wrist Palm     Palm 1.8 1.8 0.0 87.3 67.7 22.5       Radial Anti Sensory (Base 1st Digit)  31.2C  Wrist 2.1 2.1 0.0 37.6 30.5 18.9 Wrist Base 1st Digit     Ulnar Anti Sensory (5th Digit)  31.2C  Wrist 3.2 3.3 0.1 79.4 68.8 13.4 Wrist 5th Digit 44 42 2   Motor Left/Right Comparison   Stim Site L Lat (ms) R Lat (ms) L-R Lat (ms) L Amp (mV) R Amp (mV) L-R Amp (%) Site1 Site2 L Vel (m/s) R Vel (m/s) L-R Vel (m/s)  Median Motor (Abd Poll Brev)  31.1C  Wrist 3.4 3.8 0.4 8.2 8.3 1.2 Elbow Wrist 53 51 2  Elbow 7.4 7.7 0.3 5.1 8.4 39.3       Ulnar Motor (Abd Dig Min)  31.2C  Wrist 2.9 3.0 0.1 10.3  12.9 20.2 B Elbow Wrist 58 61 3  B Elbow 6.1 6.2 0.1 9.4 12.6 25.4 A Elbow B Elbow 86 100 14  A Elbow 7.2 7.2 0.0 8.4 12.2 31.1          Waveforms:

## 2017-04-30 NOTE — Progress Notes (Signed)
Holly Miranda - 41 y.o. female MRN [id number]  Date of birth: 01-17-1976  Office Visit Note: Visit Date: 04/26/2017 PCP: Clent Demark, PA-C Referred by: Clent Demark, PA-C  Subjective: Chief Complaint  Patient presents with  . Right Hand - Pain  . Left Hand - Pain   HPI: Holly Miranda is a 41 year old right-hand dominant female with 2 year history of chronic worsening neck pain with referral into the upper back and shoulders in both hands with numbness and tingling in a nondermatomal fashion. She reports weakness in both hands with the left somewhat worse than the right. She reports worsening when using her hands a lot and reports that actually the first digit and fifth digit are worse in both hands. She does get this feeling and all the digits however. She's had no prior carpal tunnel surgery or neck surgery. She has a cervical spine MRI on order. Dr. Erlinda Hong has been following her and request electrodiagnostic study. She has not had prior electrodiagnostic study. She does housekeeping for a living.    ROS Otherwise per HPI.  Assessment & Plan: Visit Diagnoses:  1. Paresthesia of skin     Plan: No additional findings.  Impression: Essentially NORMAL electrodiagnostic study of both upper limbs.  There is no significant electrodiagnostic evidence of nerve entrapment, brachial plexopathy, cervical radiculopathy or generalized peripheral neuropathy.    As you know, purely sensory or demyelinating radiculopathies and chemical radiculitis may not be detected with this particular electrodiagnostic study.  This electrodiagnostic study cannot rule out small fiber polyneuropathy and dysesthesias from central pain sensitization syndromes such as fibromyalgia.  Recommendations: 1.  Follow-up with referring physician. 2.  Continue current management of symptoms.   Meds & Orders: No orders of the defined types were placed in this encounter.   Orders Placed This Encounter  Procedures   . NCV with EMG (electromyography)    Follow-up: Return for Dr. Erlinda Hong after MRI.   Procedures: No procedures performed  EMG & NCV Findings: All nerve conduction studies (as indicated in the following tables) were within normal limits.  All left vs. right side differences were within normal limits.    All examined muscles (as indicated in the following table) showed no evidence of electrical instability.    Impression: Essentially NORMAL electrodiagnostic study of both upper limbs.  There is no significant electrodiagnostic evidence of nerve entrapment, brachial plexopathy, cervical radiculopathy or generalized peripheral neuropathy.    As you know, purely sensory or demyelinating radiculopathies and chemical radiculitis may not be detected with this particular electrodiagnostic study.  This electrodiagnostic study cannot rule out small fiber polyneuropathy and dysesthesias from central pain sensitization syndromes such as fibromyalgia.  Recommendations: 1.  Follow-up with referring physician. 2.  Continue current management of symptoms.   Nerve Conduction Studies Anti Sensory Summary Table   Stim Site NR Peak (ms) Norm Peak (ms) P-T Amp (V) Norm P-T Amp Site1 Site2 Delta-P (ms) Dist (cm) Vel (m/s) Norm Vel (m/s)  Left Median Acr Palm Anti Sensory (2nd Digit)  31C  Wrist    3.3 <3.6 75.1 >10 Wrist Palm 1.5 0.0    Palm    1.8 <2.0 87.3         Right Median Acr Palm Anti Sensory (2nd Digit)  30.4C  Wrist    3.4 <3.6 57.7 >10 Wrist Palm 1.6 0.0    Palm    1.8 <2.0 67.7         Left Radial Anti Sensory [facility]  1st Digit)  31.2C  Wrist    2.1 <3.1 37.6  Wrist Base 1st Digit 2.1 0.0    Right Radial Anti Sensory (Base 1st Digit)  31.4C  Wrist    2.1 <3.1 30.5  Wrist Base 1st Digit 2.1 0.0    Left Ulnar Anti Sensory (5th Digit)  31.2C  Wrist    3.2 <3.7 79.4 >15.0 Wrist 5th Digit 3.2 14.0 44 >38  Right Ulnar Anti Sensory (5th Digit)  30.8C  Wrist    3.3 <3.7 68.8 >15.0 Wrist 5th  Digit 3.3 14.0 42 >38   Motor Summary Table   Stim Site NR Onset (ms) Norm Onset (ms) O-P Amp (mV) Norm O-P Amp Site1 Site2 Delta-0 (ms) Dist (cm) Vel (m/s) Norm Vel (m/s)  Left Median Motor (Abd Poll Brev)  31.1C  Wrist    3.4 <4.2 8.2 >5 Elbow Wrist 4.0 21.0 53 >50  Elbow    7.4  5.1         Right Median Motor (Abd Poll Brev)  31.6C  Wrist    3.8 <4.2 8.3 >5 Elbow Wrist 3.9 20.0 51 >50  Elbow    7.7  8.4         Left Ulnar Motor (Abd Dig Min)  31.2C  Wrist    2.9 <4.2 10.3 >3 B Elbow Wrist 3.2 18.5 58 >53  B Elbow    6.1  9.4  A Elbow B Elbow 1.1 9.5 86 >53  A Elbow    7.2  8.4         Right Ulnar Motor (Abd Dig Min)  31.7C  Wrist    3.0 <4.2 12.9 >3 B Elbow Wrist 3.2 19.5 61 >53  B Elbow    6.2  12.6  A Elbow B Elbow 1.0 10.0 100 >53  A Elbow    7.2  12.2          EMG   Side Muscle Nerve Root Ins Act Fibs Psw Amp Dur Poly Recrt Int Fraser Din Comment  Left 1stDorInt Ulnar C8-T1 Nml Nml Nml Nml Nml 0 Nml Nml   Left Abd Poll Brev Median C8-T1 Nml Nml Nml Nml Nml 0 Nml Nml   Left ExtDigCom   Nml Nml Nml Nml Nml 0 Nml Nml   Left Triceps Radial C6-7-8 Nml Nml Nml Nml Nml 0 Nml Nml   Left Deltoid Axillary C5-6 Nml Nml Nml Nml Nml 0 Nml Nml     Nerve Conduction Studies Anti Sensory Left/Right Comparison   Stim Site L Lat (ms) R Lat (ms) L-R Lat (ms) L Amp (V) R Amp (V) L-R Amp (%) Site1 Site2 L Vel (m/s) R Vel (m/s) L-R Vel (m/s)  Median Acr Palm Anti Sensory (2nd Digit)  31C  Wrist 3.3 3.4 0.1 75.1 57.7 23.2 Wrist Palm     Palm 1.8 1.8 0.0 87.3 67.7 22.5       Radial Anti Sensory (Base 1st Digit)  31.2C  Wrist 2.1 2.1 0.0 37.6 30.5 18.9 Wrist Base 1st Digit     Ulnar Anti Sensory (5th Digit)  31.2C  Wrist 3.2 3.3 0.1 79.4 68.8 13.4 Wrist 5th Digit 44 42 2   Motor Left/Right Comparison   Stim Site L Lat (ms) R Lat (ms) L-R Lat (ms) L Amp (mV) R Amp (mV) L-R Amp (%) Site1 Site2 L Vel (m/s) R Vel (m/s) L-R Vel (m/s)  Median Motor (Abd Poll Brev)  31.1C  Wrist 3.4 3.8 0.4  8.2 8.3 1.2 Elbow  Wrist 53 51 2  Elbow 7.4 7.7 0.3 5.1 8.4 39.3       Ulnar Motor (Abd Dig Min)  31.2C  Wrist 2.9 3.0 0.1 10.3 12.9 20.2 B Elbow Wrist 58 61 3  B Elbow 6.1 6.2 0.1 9.4 12.6 25.4 A Elbow B Elbow 86 100 14  A Elbow 7.2 7.2 0.0 8.4 12.2 31.1          Waveforms:                     Clinical History: No specialty comments available.  She reports that she has never smoked. She has never used smokeless tobacco.   Recent Labs  04/03/17 1756  HGBA1C 5.3    Objective:  VS:  HT:    WT:   BMI:     BP:   HR: bpm  TEMP: ( )  RESP:  Physical Exam  Musculoskeletal:  Inspection reveals no atrophy of the bilateral APB or FDI or hand intrinsics. There is no swelling, color changes, allodynia or dystrophic changes. There is 5 out of 5 strength in the bilateral wrist extension, finger abduction and long finger flexion. There is intact sensation to light touch in all dermatomal and peripheral nerve distributions. There is a negative Tinel's test at the bilateral wrist and elbow. There is a negative Hoffmann's test bilaterally.    Ortho Exam Imaging: No results found.  Past Medical/Family/Surgical/Social History: Medications & Allergies reviewed per EMR Patient Active Problem List   Diagnosis Date Noted  . Cervical radiculopathy 04/09/2017  . PLANTAR FASCIITIS, LEFT 12/20/2006  . FOOT PAIN, BILATERAL 12/20/2006   Past Medical History:  Diagnosis Date  . Anemia    Family History  Problem Relation Age of Onset  . Colitis Mother    History reviewed. No pertinent surgical history. Social History   Occupational History  . Not on file.   Social History Main Topics  . Smoking status: Never Smoker  . Smokeless tobacco: Never Used  . Alcohol use No  . Drug use: No  . Sexual activity: Not on file

## 2017-05-01 ENCOUNTER — Ambulatory Visit (INDEPENDENT_AMBULATORY_CARE_PROVIDER_SITE_OTHER): Payer: Self-pay | Admitting: Orthopaedic Surgery

## 2017-05-04 ENCOUNTER — Ambulatory Visit
Admission: RE | Admit: 2017-05-04 | Discharge: 2017-05-04 | Disposition: A | Discharge status: Home / Self Care | Payer: No Typology Code available for payment source | Source: Ambulatory Visit | Attending: Physician Assistant | Admitting: Physician Assistant

## 2017-05-04 ENCOUNTER — Ambulatory Visit
Admission: RE | Admit: 2017-05-04 | Discharge: 2017-05-04 | Disposition: A | Discharge status: Home / Self Care | Payer: No Typology Code available for payment source | Source: Ambulatory Visit | Attending: Orthopaedic Surgery | Admitting: Orthopaedic Surgery

## 2017-05-04 DIAGNOSIS — R109 Unspecified abdominal pain: Secondary | ICD-10-CM

## 2017-05-04 DIAGNOSIS — M5412 Radiculopathy, cervical region: Secondary | ICD-10-CM

## 2017-05-04 MED ORDER — IOPAMIDOL (ISOVUE-300) INJECTION 61%: 100.0000 mL | Freq: Once | INTRAVENOUS | Status: DC | PRN

## 2017-05-08 ENCOUNTER — Telehealth (INDEPENDENT_AMBULATORY_CARE_PROVIDER_SITE_OTHER): Payer: Self-pay | Admitting: Physician Assistant

## 2017-05-08 ENCOUNTER — Ambulatory Visit (INDEPENDENT_AMBULATORY_CARE_PROVIDER_SITE_OTHER): Payer: Self-pay | Admitting: Orthopaedic Surgery

## 2017-05-08 ENCOUNTER — Telehealth (INDEPENDENT_AMBULATORY_CARE_PROVIDER_SITE_OTHER): Payer: Self-pay

## 2017-05-08 ENCOUNTER — Encounter (INDEPENDENT_AMBULATORY_CARE_PROVIDER_SITE_OTHER): Payer: Self-pay | Admitting: Orthopaedic Surgery

## 2017-05-08 DIAGNOSIS — M5412 Radiculopathy, cervical region: Secondary | ICD-10-CM

## 2017-05-08 NOTE — Telephone Encounter (Signed)
-----   Message from Clent Demark, PA-C sent at 05/07/2017  5:54 PM EDT ----- CT abdomen and pelvis normal.

## 2017-05-08 NOTE — Telephone Encounter (Signed)
Called using alfonso [phone] with PI patient has voicemail that is not set up. If patient calls office please let her know that her CT abdomen and pelvis was normal. Nat Christen, CMA

## 2017-05-08 NOTE — Telephone Encounter (Signed)
Pt called to request the result for her ct scan and mri, please follow up

## 2017-05-08 NOTE — Progress Notes (Signed)
   Office Visit Note   Patient: Holly Miranda           Date of Birth: 1975/10/17           MRN: [id number] Visit Date: 05/08/2017              Requested by: Clent Demark, PA-C [place], [place] [id number] PCP: Clent Demark, PA-C   Assessment & Plan: Visit Diagnoses:  1. Cervical radiculopathy     Plan: MRI of the cervical spine shows degenerative disc disease at C4-5 and C5-6. Recommend epidural 7 injection with Dr. Ernestina Patches. Questions encouraged and answered.  Follow-Up Instructions: Return if symptoms worsen or fail to improve.   Orders:  Orders Placed This Encounter  Procedures  . Ambulatory referral to Physical Medicine Rehab   No orders of the defined types were placed in this encounter.     Procedures: No procedures performed   Clinical Data: No additional findings.   Subjective: Chief Complaint  Patient presents with  . Neck - Pain, Follow-up    Patient follows up today for her cervical radiculopathy. Nerve conduction studies were normal for carpal tunnel syndrome.    Review of Systems  Constitutional: Negative.   HENT: Negative.   Eyes: Negative.   Respiratory: Negative.   Cardiovascular: Negative.   Endocrine: Negative.   Musculoskeletal: Negative.   Neurological: Negative.   Hematological: Negative.   Psychiatric/Behavioral: Negative.   All other systems reviewed and are negative.    Objective: Vital Signs: There were no vitals taken for this visit.  Physical Exam  Constitutional: She is oriented to person, place, and time. She appears well-developed and well-nourished.  Pulmonary/Chest: Effort normal.  Neurological: She is alert and oriented to person, place, and time.  Skin: Skin is warm. Capillary refill takes less than 2 seconds.  Psychiatric: She has a normal mood and affect. Her behavior is normal. Judgment and thought content normal.  Nursing note and vitals reviewed.   Ortho Exam Exam is  stable Specialty Comments:  No specialty comments available.  Imaging: No results found.   PMFS History: Patient Active Problem List   Diagnosis Date Noted  . Cervical radiculopathy 04/09/2017  . PLANTAR FASCIITIS, LEFT 12/20/2006  . FOOT PAIN, BILATERAL 12/20/2006   Past Medical History:  Diagnosis Date  . Anemia     Family History  Problem Relation Age of Onset  . Colitis Mother     No past surgical history on file. Social History   Occupational History  . Not on file.   Social History Main Topics  . Smoking status: Never Smoker  . Smokeless tobacco: Never Used  . Alcohol use No  . Drug use: No  . Sexual activity: Not on file

## 2017-05-28 ENCOUNTER — Encounter (INDEPENDENT_AMBULATORY_CARE_PROVIDER_SITE_OTHER): Payer: Self-pay | Admitting: Physical Medicine and Rehabilitation

## 2017-09-04 ENCOUNTER — Telehealth (INDEPENDENT_AMBULATORY_CARE_PROVIDER_SITE_OTHER): Payer: Self-pay | Admitting: Physician Assistant

## 2017-09-04 NOTE — Telephone Encounter (Signed)
FWD to PCP. Holly Miranda Holly Miranda, CMA  

## 2017-09-04 NOTE — Telephone Encounter (Signed)
Dr Frederico Hamman office called [facility]) to ask the pcp to order a CBC labd and ECG and chest x-ray for this Pt, since they need it for pre-op, if you have any question to please call  Her back [phone], and also please call Pt to schedule this appt, please follow up

## 2017-09-05 NOTE — Telephone Encounter (Signed)
Holly Miranda, you could have saved a step and just scheduled this patient for [place]. Please call patient and have her come in for a pre-op visit.

## 2017-09-06 NOTE — Telephone Encounter (Signed)
Per PCP request I called Pt and schedule appt, and per Pt request we schedule for 09/19/17

## 2017-09-12 ENCOUNTER — Ambulatory Visit: Payer: Self-pay

## 2017-09-12 ENCOUNTER — Ambulatory Visit: Payer: Self-pay | Attending: Internal Medicine

## 2017-09-19 ENCOUNTER — Ambulatory Visit (INDEPENDENT_AMBULATORY_CARE_PROVIDER_SITE_OTHER): Payer: Self-pay | Admitting: Physician Assistant

## 2017-09-19 ENCOUNTER — Encounter (INDEPENDENT_AMBULATORY_CARE_PROVIDER_SITE_OTHER): Payer: Self-pay | Admitting: Physician Assistant

## 2017-09-19 VITALS — BP 96/63 | HR 77 | Temp 97.9°F | Resp 18 | Ht 62.0 in | Wt 152.0 lb

## 2017-09-19 DIAGNOSIS — H509 Unspecified strabismus: Secondary | ICD-10-CM

## 2017-09-19 DIAGNOSIS — Z01818 Encounter for other preprocedural examination: Secondary | ICD-10-CM

## 2017-09-19 DIAGNOSIS — Z114 Encounter for screening for human immunodeficiency virus [HIV]: Secondary | ICD-10-CM

## 2017-09-19 DIAGNOSIS — Z23 Encounter for immunization: Secondary | ICD-10-CM

## 2017-09-19 LAB — POCT URINE PREGNANCY: Preg Test, Ur: NEGATIVE

## 2017-09-19 NOTE — Patient Instructions (Signed)
Please go to [facility]. [place] for your electrocardiogram.  Please go to the radiology department at [facility] for your chest x ray.

## 2017-09-19 NOTE — Progress Notes (Signed)
Subjective:  Patient ID: Holly Miranda, female    DOB: June 09, 1976  Age: 42 y.o. MRN: [id number]  CC: pre-op exam  HPI  SHANIK BROOKSHIRE is a 42 y.o. female with a medical history of H pylori infection, cervical radiculopathy 2/2 to C4-C5-C6 DDD,  and diverticula presents for a pre-op exam. She will have right eye strabismus correction at the [facility]. No other complaints or symptoms.      Outpatient Medications Prior to Visit  Medication Sig Dispense Refill  . Cholecalciferol (VITAMIN D-3) 5000 units TABS Take 1 tablet by mouth daily. 30 tablet 0  . pantoprazole (PROTONIX) 40 MG tablet Take 1 tablet by mouth every morning. (Patient not taking: Reported on 09/19/2017) 90 tablet 3  . cyclobenzaprine (FLEXERIL) 10 MG tablet Take 1 tablet (10 mg total) by mouth at bedtime. (Patient not taking: Reported on 09/19/2017) 14 tablet 1  . linaclotide (LINZESS) 290 MCG CAPS capsule Take 1 capsule (290 mcg total) by mouth daily before breakfast. 30 capsule 0  . metoCLOPramide (REGLAN) 5 MG tablet Take 1 tablet (5 mg total) by mouth 2 (two) times daily. 60 tablet 0  . naproxen (NAPROSYN) 500 MG tablet Take 1 tablet (500 mg total) by mouth 2 (two) times daily with a meal. (Patient not taking: Reported on 04/03/2017) 30 tablet 0  . Probiotic Product ([place]) CAPS Take 1 capsule by mouth daily. 30 capsule 2   No facility-administered medications prior to visit.      ROS Review of Systems  Constitutional: Negative for chills, fever and malaise/fatigue.  Eyes: Negative for blurred vision.  Respiratory: Negative for shortness of breath.   Cardiovascular: Negative for chest pain and palpitations.  Gastrointestinal: Negative for abdominal pain and nausea.  Genitourinary: Negative for dysuria and hematuria.  Musculoskeletal: Negative for joint pain and myalgias.  Skin: Negative for rash.  Neurological: Negative for tingling and headaches.  Psychiatric/Behavioral: Negative for  depression. The patient is not nervous/anxious.     Objective:  BP 96/63 (BP Location: Left Arm, Patient Position: Sitting, Cuff Size: Normal)   Pulse 77   Temp 97.9 F (36.6 C) (Oral)   Resp 18   Ht 5\' 2"  (1.575 m)   Wt 152 lb (68.9 kg)   LMP 09/19/2017   SpO2 99%   BMI 27.80 kg/m   BP/Weight 09/19/2017 04/03/2017 6/73/4193  Systolic BP 96 92 790  Diastolic BP 63 58 75  Wt. (Lbs) 152 148 147.2  BMI 27.8 27.07 24.5      Physical Exam  Constitutional: She is oriented to person, place, and time.  Well developed, well nourished, NAD, polite  HENT:  Head: Normocephalic and atraumatic.  Eyes: No scleral icterus.  Neck: Normal range of motion. Neck supple. No thyromegaly present.  Cardiovascular: Normal rate, regular rhythm and normal heart sounds.  Pulmonary/Chest: Effort normal and breath sounds normal.  Musculoskeletal: She exhibits no edema.  Neurological: She is alert and oriented to person, place, and time.  Mild strabismus of right eye.  Skin: Skin is warm and dry. No rash noted. No erythema. No pallor.  Psychiatric: She has a normal mood and affect. Her behavior is normal. Thought content normal.  Vitals reviewed.    Assessment & Plan:    1. Pre-operative exam - CBC with Differential - Comprehensive metabolic panel - PT AND PTT - DG Chest 2 View; Future - EKG 12-Lead - POCT urine pregnancy  2. Strabismus - Pt will be having surgical correction at  [facility] after pre-op examination completed.   3. Screening for HIV (human immunodeficiency virus) - HIV antibody  4. Need for prophylactic vaccination and inoculation against influenza - Flu Vaccine QUAD 6+ mos PF IM (Fluarix Quad PF)   Follow-up: Return if symptoms worsen or fail to improve, for PAP.   Clent Demark PA

## 2017-09-20 LAB — CBC WITH DIFFERENTIAL/PLATELET
Basophils Absolute: 0 10*3/uL (ref 0.0–0.2)
Basos: 0 %
EOS (ABSOLUTE): 0.3 10*3/uL (ref 0.0–0.4)
Eos: 6 %
HEMOGLOBIN: 13.9 g/dL (ref 11.1–15.9)
Hematocrit: 41 % (ref 34.0–46.6)
IMMATURE GRANS (ABS): 0 10*3/uL (ref 0.0–0.1)
IMMATURE GRANULOCYTES: 0 %
Lymphocytes Absolute: 1.9 10*3/uL (ref 0.7–3.1)
Lymphs: 34 %
MCH: 31.2 pg (ref 26.6–33.0)
MCHC: 33.9 g/dL (ref 31.5–35.7)
MCV: 92 fL (ref 79–97)
MONOCYTES: 7 %
Monocytes Absolute: 0.4 10*3/uL (ref 0.1–0.9)
NEUTROS PCT: 53 %
Neutrophils Absolute: 3 10*3/uL (ref 1.4–7.0)
Platelets: 306 10*3/uL (ref 150–379)
RBC: 4.45 x10E6/uL (ref 3.77–5.28)
RDW: 13.3 % (ref 12.3–15.4)
WBC: 5.6 10*3/uL (ref 3.4–10.8)

## 2017-09-20 LAB — COMPREHENSIVE METABOLIC PANEL
ALBUMIN: 4 g/dL (ref 3.5–5.5)
ALT: 15 IU/L (ref 0–32)
AST: 17 IU/L (ref 0–40)
Albumin/Globulin Ratio: 1.2 (ref 1.2–2.2)
Alkaline Phosphatase: 64 IU/L (ref 39–117)
BUN / CREAT RATIO: 15 (ref 9–23)
BUN: 10 mg/dL (ref 6–24)
Bilirubin Total: 0.3 mg/dL (ref 0.0–1.2)
CALCIUM: 9.1 mg/dL (ref 8.7–10.2)
CO2: 23 mmol/L (ref 20–29)
Chloride: 102 mmol/L (ref 96–106)
Creatinine, Ser: 0.67 mg/dL (ref 0.57–1.00)
GFR calc Af Amer: 126 mL/min/{1.73_m2} (ref >59)
GFR, EST NON AFRICAN AMERICAN: 110 mL/min/{1.73_m2} (ref >59)
GLOBULIN, TOTAL: 3.3 g/dL (ref 1.5–4.5)
Glucose: 85 mg/dL (ref 65–99)
Potassium: 3.8 mmol/L (ref 3.5–5.2)
SODIUM: 140 mmol/L (ref 134–144)
Total Protein: 7.3 g/dL (ref 6.0–8.5)

## 2017-09-20 LAB — PT AND PTT

## 2017-09-20 LAB — HIV ANTIBODY (ROUTINE TESTING W REFLEX): HIV SCREEN 4TH GENERATION: NONREACTIVE

## 2017-09-24 ENCOUNTER — Ambulatory Visit (HOSPITAL_COMMUNITY)
Admission: RE | Admit: 2017-09-24 | Discharge: 2017-09-24 | Disposition: A | Discharge status: Home / Self Care | Payer: Self-pay | Source: Ambulatory Visit | Attending: Physician Assistant | Admitting: Physician Assistant

## 2017-09-24 DIAGNOSIS — I498 Other specified cardiac arrhythmias: Secondary | ICD-10-CM | POA: Insufficient documentation

## 2017-09-24 DIAGNOSIS — H509 Unspecified strabismus: Secondary | ICD-10-CM | POA: Insufficient documentation

## 2017-09-24 DIAGNOSIS — Z01818 Encounter for other preprocedural examination: Secondary | ICD-10-CM | POA: Insufficient documentation

## 2017-09-26 NOTE — Telephone Encounter (Signed)
Medical Assistant used [employer] to contact patient.  Interpreter Name: Park Pope #: [id number] Patient verified DOB Patient is aware of pre op chest xray. [facility] are able to review the report.

## 2017-09-26 NOTE — Telephone Encounter (Signed)
-----   Message from Clent Demark, PA-C sent at 09/25/2017  8:37 AM EST ----- Normal pre-op chest xray. Not sure if her surgeon has access to this, would you ask please? We may have to fax out the pre-op results.

## 2017-09-28 ENCOUNTER — Other Ambulatory Visit (HOSPITAL_COMMUNITY)
Admission: RE | Admit: 2017-09-28 | Discharge: 2017-09-28 | Disposition: A | Discharge status: Home / Self Care | Payer: Self-pay | Source: Ambulatory Visit | Attending: Physician Assistant | Admitting: Physician Assistant

## 2017-09-28 ENCOUNTER — Encounter (INDEPENDENT_AMBULATORY_CARE_PROVIDER_SITE_OTHER): Payer: Self-pay | Admitting: Physician Assistant

## 2017-09-28 ENCOUNTER — Ambulatory Visit (INDEPENDENT_AMBULATORY_CARE_PROVIDER_SITE_OTHER): Payer: Self-pay | Admitting: Physician Assistant

## 2017-09-28 VITALS — BP 109/71 | HR 85 | Temp 97.6°F | Resp 18 | Ht 62.0 in | Wt 151.0 lb

## 2017-09-28 DIAGNOSIS — Z124 Encounter for screening for malignant neoplasm of cervix: Secondary | ICD-10-CM

## 2017-09-28 DIAGNOSIS — Z01818 Encounter for other preprocedural examination: Secondary | ICD-10-CM

## 2017-09-28 NOTE — Progress Notes (Addendum)
   Subjective:  Patient ID: Holly Miranda, female    DOB: [birth year]  Age: 42 y.o. MRN: [id number]  CC: PAP  HPI Holly Miranda is a 42 y.o. female with a medical history of anemia and strabismus presents to have Pap smear collected. No vaginal/pelvic complaints. No hx of abnormal pap.     Pt will be having strabismus correction surgery in five days. Most pre-op labs were completed but CMA drew wrong tube for PT/PTT. Patient will have recollection today.   Outpatient Medications Prior to Visit  Medication Sig Dispense Refill  . Cholecalciferol (VITAMIN D-3) 5000 units TABS Take 1 tablet by mouth daily. 30 tablet 0  . pantoprazole (PROTONIX) 40 MG tablet Take 1 tablet by mouth every morning. (Patient not taking: Reported on 09/19/2017) 90 tablet 3   No facility-administered medications prior to visit.      ROS Review of Systems  Constitutional: Negative for chills, fever and malaise/fatigue.  Eyes: Negative for blurred vision.  Respiratory: Negative for shortness of breath.   Cardiovascular: Negative for chest pain and palpitations.  Gastrointestinal: Negative for abdominal pain and nausea.  Genitourinary: Negative for dysuria and hematuria.  Musculoskeletal: Negative for joint pain and myalgias.  Skin: Negative for rash.  Neurological: Negative for tingling and headaches.  Psychiatric/Behavioral: Negative for depression. The patient is not nervous/anxious.     Objective:  BP 109/71 (BP Location: Left Arm, Patient Position: Sitting, Cuff Size: Large)   Pulse 85   Temp 97.6 F (36.4 C) (Oral)   Resp 18   Ht 5\' 2"  (1.575 m)   Wt 151 lb (68.5 kg)   LMP 09/19/2017   SpO2 95%   BMI 27.62 kg/m   BP/Weight 09/28/2017 09/19/2017 8/67/6720  Systolic BP 947 96 92  Diastolic BP 71 63 58  Wt. (Lbs) 151 152 148  BMI 27.62 27.8 27.07      Physical Exam  Constitutional: She is oriented to person, place, and time.  Well developed, well nourished, NAD, polite  Pulmonary/Chest:  Effort normal.  Genitourinary:  Genitourinary Comments: No vaginal discharge, cervix friable, no cervical motion tenderness, no adnexal mass or tenderness bilaterally, no uterine mass or tenderness  Neurological: She is alert and oriented to person, place, and time.  Skin: Skin is warm and dry.  No genital lesions  Psychiatric: She has a normal mood and affect. Her behavior is normal. Thought content normal.  Vitals reviewed.    Assessment & Plan:    1. Screening for cervical cancer - Cytology - PAP([place])  2. Pre-operative exam - For the purpose of Strabismus surgery at [place] previously collected PT/PTT in wrong tube. - PT AND PTT   Follow-up: Return if symptoms worsen or fail to improve.   Clent Demark PA

## 2017-09-28 NOTE — Patient Instructions (Signed)
Prueba de Papanicolaou  (Pap Test)  ¿POR QUÉ ME DEBO REALIZAR ESTA PRUEBA?  A esta prueba también se la denomina "frotis de Pap". Es una prueba de detección que se utiliza para detectar signos de cáncer de vagina, cuello del útero y útero. La prueba también puede identificar la presencia de infección o cambios precancerosos. El médico probablemente le recomiende que se realice esta prueba en forma regular. Esta prueba puede realizarse de la siguiente manera:  · Cada 3 años, a partir de los 21 años.  · Cada 5 años, en combinación con las pruebas que se realizan para detectar la presencia del virus del papiloma humano (VPH).  · Con mayor o menor frecuencia, en función de otras enfermedades que tenga.  ¿QUÉ TIPO DE MUESTRA SE TOMA?  El médico utilizará un pequeño hisopo de algodón, una espátula de plástico o un cepillo para recolectar una muestra de células de la superficie del cuello del útero. El cuello del útero es la apertura del útero, que también se conoce como matriz. También pueden recolectarse las secreciones del cuello del útero y la vagina.  ¿CÓMO DEBO PREPARARME PARA ESTA PRUEBA?  · Tenga en cuenta en qué etapa del ciclo menstrual se encuentra. Es posible que deba reprogramar la prueba si está menstruando el día en que debe realizársela.  · Si el día en que debe realizarse la prueba tiene una infección vaginal aparente, deberá reprogramar la prueba.  · Pueden pedirle que evite tomar una ducha o baño el día de la prueba o el día anterior.  · Algunos medicamentos pueden provocar resultados anormales de la prueba, como los digitálicos y la tetraciclina. Si toma alguno de estos medicamentos, hable con su médico antes de realizarse la prueba.  ¿QUÉ SIGNIFICAN LOS RESULTADOS?  Los resultados anormales de la prueba pueden indicar diversas enfermedades. Estas pueden incluir lo siguiente:  · Cáncer. Si bien los resultados de la prueba de Papanicolaou no pueden  utilizarse para diagnosticar cáncer de cuello del útero, de vagina o de útero, pueden indicar que existe una posibilidad de presencia de cáncer. En este caso, será necesario realizar pruebas adicionales para determinar la presencia de cáncer.  · Enfermedad de transmisión sexual.  · Infecciones por hongos.  · Infección por parásitos.  · Infección por herpes.  · Una enfermedad que causa o favorece la infertilidad.  Es su responsabilidad retirar el resultado del estudio. Consulte en el laboratorio o en el departamento en el que fue realizado el estudio cuándo y cómo podrá obtener los resultados. Comuníquese con el médico si tiene preguntas sobre los resultados.  Esta información no tiene como fin reemplazar el consejo del médico. Asegúrese de hacerle al médico cualquier pregunta que tenga.  Document Released: 01/17/2008 Document Revised: 08/21/2014 Document Reviewed: 12/22/2013  Elsevier Interactive Patient Education © 2018 [employer]

## 2017-09-29 LAB — PT AND PTT
INR: 1 (ref 0.8–1.2)
Prothrombin Time: 10.3 s (ref 9.1–12.0)
aPTT: 30 s (ref 24–33)

## 2017-10-01 LAB — CYTOLOGY - PAP
BACTERIAL VAGINITIS: NEGATIVE
Candida vaginitis: POSITIVE — AB
Chlamydia: NEGATIVE
Diagnosis: NEGATIVE
NEISSERIA GONORRHEA: NEGATIVE
TRICH (WINDOWPATH): NEGATIVE

## 2017-10-02 ENCOUNTER — Other Ambulatory Visit (INDEPENDENT_AMBULATORY_CARE_PROVIDER_SITE_OTHER): Payer: Self-pay | Admitting: Physician Assistant

## 2017-10-02 DIAGNOSIS — B373 Candidiasis of vulva and vagina: Secondary | ICD-10-CM

## 2017-10-02 DIAGNOSIS — B3731 Acute candidiasis of vulva and vagina: Secondary | ICD-10-CM

## 2017-10-02 MED ORDER — FLUCONAZOLE 150 MG PO TABS: 150.0000 mg | ORAL_TABLET | Freq: Once | ORAL | 0 refills | Status: AC

## 2017-10-02 NOTE — Progress Notes (Signed)
I have added the purpose of the pre-op examination which is for strabismus surgery.

## 2017-10-12 ENCOUNTER — Telehealth (INDEPENDENT_AMBULATORY_CARE_PROVIDER_SITE_OTHER): Payer: Self-pay | Admitting: *Deleted

## 2017-10-12 NOTE — Telephone Encounter (Signed)
Medical Assistant used [employer] to contact patient.  Interpreter Name: Ander Purpura Interpreter #: [phone] Patient verified DOB Patient is aware of PAP being negative for cancer and positive for yeast. Patient should pick up Diflucan from [facility] and PT/PTT results were faxed to eye care center.  No further questions.

## 2017-10-12 NOTE — Telephone Encounter (Signed)
-----   Message from Clent Demark, PA-C sent at 10/02/2017  1:48 PM EST ----- Patient's pap negative for cancer. Positive for yeast. I have sent fluconazole to [facility]. PT/PTT result has been faxed to [facility].

## 2017-10-25 MED FILL — FLUCONAZOLE 150 MG TABLET: 150 | 1 days supply | Qty: 1 | Fill #0

## 2017-11-07 ENCOUNTER — Encounter (INDEPENDENT_AMBULATORY_CARE_PROVIDER_SITE_OTHER): Payer: Self-pay | Admitting: Physician Assistant

## 2017-11-07 ENCOUNTER — Ambulatory Visit (INDEPENDENT_AMBULATORY_CARE_PROVIDER_SITE_OTHER): Payer: Self-pay | Admitting: Physician Assistant

## 2017-11-07 ENCOUNTER — Other Ambulatory Visit: Payer: Self-pay

## 2017-11-07 VITALS — BP 119/82 | HR 72 | Temp 97.6°F | Ht 62.0 in | Wt 150.8 lb

## 2017-11-07 DIAGNOSIS — J0100 Acute maxillary sinusitis, unspecified: Secondary | ICD-10-CM

## 2017-11-07 MED ORDER — AMOXICILLIN-POT CLAVULANATE 875-125 MG PO TABS: 1.0000 | ORAL_TABLET | Freq: Two times a day (BID) | ORAL | 0 refills | Status: AC

## 2017-11-07 MED ORDER — NAPROXEN 500 MG PO TABS: 500.0000 mg | ORAL_TABLET | Freq: Two times a day (BID) | ORAL | 0 refills | Status: AC

## 2017-11-07 NOTE — Progress Notes (Signed)
Subjective:  Patient ID: Holly Miranda, female    DOB: 02-08-1976  Age: 42 y.o. MRN: [id number]  CC: headache, URI  HPI Holly Miranda is a 42 y.o. female with a medical history of anemia and strabismus presents with 10 day onset of headache "everywhere", cough, fever, chills, bone aches, nasal congestion, bilateral eye pain, and tinnitus. Was exposed to her sick son and was diagnosed with viral illness. Has taken Alka Selzter cough, Tylenol, Nyquil, Theraflu, and "AB". Says she had mild relief of her headache with the OTC products but relief was only temporary. Does not endorse any other symptoms or complaints.       Outpatient Medications Prior to Visit  Medication Sig Dispense Refill  . Cholecalciferol (VITAMIN D-3) 5000 units TABS Take 1 tablet by mouth daily. 30 tablet 0  . pantoprazole (PROTONIX) 40 MG tablet Take 1 tablet by mouth every morning. (Patient not taking: Reported on 09/19/2017) 90 tablet 3   No facility-administered medications prior to visit.      ROS Review of Systems  Constitutional: Positive for chills, fever and malaise/fatigue.  HENT: Positive for congestion, sinus pain and sore throat.   Eyes: Positive for pain. Negative for blurred vision.  Respiratory: Negative for cough and shortness of breath.   Cardiovascular: Negative for chest pain and palpitations.  Gastrointestinal: Negative for abdominal pain, nausea and vomiting.  Genitourinary: Negative for dysuria and hematuria.  Musculoskeletal: Negative for joint pain and myalgias.  Skin: Negative for rash.  Neurological: Positive for headaches. Negative for tingling.  Psychiatric/Behavioral: Negative for depression. The patient is not nervous/anxious.     Objective:  BP 119/82 (BP Location: Left Arm, Patient Position: Sitting, Cuff Size: Normal)   Pulse 72   Temp 97.6 F (36.4 C) (Oral)   Ht 5\' 2"  (1.575 m)   Wt 150 lb 12.8 oz (68.4 kg)   LMP 11/02/2017 (Approximate)   SpO2 98%   BMI 27.58  kg/m   BP/Weight 11/07/2017 4/76/5465 0/10/5463  Systolic BP 681 275 96  Diastolic BP 82 71 63  Wt. (Lbs) 150.8 151 152  BMI 27.58 27.62 27.8      Physical Exam  Constitutional: She is oriented to person, place, and time.  Well developed, well nourished, mildly ill, polite  HENT:  Head: Normocephalic and atraumatic.  Mouth/Throat: No oropharyngeal exudate.  Turbinates hypertrophic. Maxillary sinus tenderness bilaterally.   Eyes: Conjunctivae are normal. No scleral icterus.  Neck: Normal range of motion. Neck supple.  Cardiovascular: Normal rate, regular rhythm and normal heart sounds.  Pulmonary/Chest: Effort normal and breath sounds normal. No respiratory distress. She has no wheezes. She has no rales.  Abdominal: Soft. Bowel sounds are normal. There is no tenderness.  Musculoskeletal: She exhibits no edema.  Lymphadenopathy:    She has cervical adenopathy (mild non-tender lymph node enlargement of the anterior cervical chain bilaterally).  Neurological: She is alert and oriented to person, place, and time. No cranial nerve deficit. Coordination normal.  Skin: Skin is warm and dry. No rash noted. No erythema. No pallor.  Psychiatric: She has a normal mood and affect. Her behavior is normal. Thought content normal.  Vitals reviewed.    Assessment & Plan:    1. Acute non-recurrent maxillary sinusitis - naproxen (NAPROSYN) 500 MG tablet; Take 1 tablet (500 mg total) by mouth 2 (two) times daily with a meal.  Dispense: 20 tablet; Refill: 0 - amoxicillin-clavulanate (AUGMENTIN) 875-125 MG tablet; Take 1 tablet by mouth 2 (two) times daily.  Dispense: 20 tablet; Refill: 0   Meds ordered this encounter  Medications  . naproxen (NAPROSYN) 500 MG tablet    Sig: Take 1 tablet (500 mg total) by mouth 2 (two) times daily with a meal.    Dispense:  20 tablet    Refill:  0    Order Specific Question:   Supervising Provider    Answer:   Tresa Garter [id number]  .  amoxicillin-clavulanate (AUGMENTIN) 875-125 MG tablet    Sig: Take 1 tablet by mouth 2 (two) times daily.    Dispense:  20 tablet    Refill:  0    Order Specific Question:   Supervising Provider    Answer:   Tresa Garter [id number]    Follow-up: Return if symptoms worsen or fail to improve.   Clent Demark PA

## 2017-11-07 NOTE — Patient Instructions (Signed)
Sinusitis en los adultos  (Sinusitis, Adult)  La sinusitis es la inflamacin y el dolor en los senos paranasales. Los senos paranasales son espacios vacos en los huesos alrededor del rostro. Estos se encuentran en estos lugares:   Alrededor de los ojos.   En la mitad de la frente.   Detrs de la nariz.   En los pmulos.  Los senos y las fosas nasales estn cubiertos de un lquido fibroso (mucosidad). Normalmente, la mucosidad drena a travs de los senos. Cuando los tejidos nasales se inflaman o hinchan, la mucosidad puede quedar atrapada o bloqueada, por lo que el aire no puede fluir por los senos paranasales. Esto permite que se desarrollen bacterias, virus y hongos, lo que produce infecciones.  CUIDADOS EN EL HOGAR  Medicamentos   Tome, use o aplique los medicamentos de venta libre y los recetados solamente como se lo haya indicado el mdico. Estos pueden incluir aerosoles nasales.   Si le recetaron un antibitico, tmelo como se lo haya indicado el mdico. No deje de tomar los antibiticos aunque comience a sentirse mejor.  Hidrtese y humidifique los ambientes   Beba suficiente agua para mantener la orina clara o de color amarillo plido.   Use un humidificador de vapor fro para mantener la humedad de su hogar por encima del 50%.   Inhale vapor durante 10a 15minutos, de 3a 4veces al da o como se lo haya indicado el mdico. Puede hacer esto en el bao con el vapor del agua caliente de la ducha.   Trate de no exponerse al aire fro o seco.  Reposo   Descanse todo lo que pueda.   Duerma con la cabeza elevada.   Asegrese de dormir lo suficiente cada noche.  Instrucciones generales   Pngase un pao caliente y hmedo en el rostro 3o 4veces al da, o como se lo haya indicado su mdico. Esto ayuda a calmar las molestias.   Lave sus manos frecuentemente con agua y jabn. Use un desinfectante para manos si no dispone de agua y jabn.   No fume. Evite estar cerca de personas que fuman (fumador  pasivo).   Concurra a todas las visitas de control como se lo haya indicado el mdico. Esto es importante.  SOLICITE AYUDA SI:   Tiene fiebre.   Los sntomas empeoran.   Los sntomas no mejoran en el perodo de 10das.  SOLICITE AYUDA DE INMEDIATO SI:   Siente un dolor de cabeza muy intenso.   No puede dejar de vomitar.   Tiene dolor o hinchazn en la zona del rostro o los ojos.   Tiene dificultad para ver.   Se siente confundido.   Tiene el cuello rgido.   Tiene dificultad para respirar.  Esta informacin no tiene como fin reemplazar el consejo del mdico. Asegrese de hacerle al mdico cualquier pregunta que tenga.  Document Released: 04/24/2012 Document Revised: 11/22/2015 Document Reviewed: 05/26/2015  Elsevier Interactive Patient Education  2018 Elsevier Inc.

## 2018-01-03 ENCOUNTER — Ambulatory Visit (INDEPENDENT_AMBULATORY_CARE_PROVIDER_SITE_OTHER): Payer: Self-pay | Admitting: Nurse Practitioner

## 2018-02-01 ENCOUNTER — Ambulatory Visit (INDEPENDENT_AMBULATORY_CARE_PROVIDER_SITE_OTHER): Payer: Self-pay | Admitting: Physician Assistant

## 2018-07-12 IMAGING — DX DG THORACIC SPINE 3V
3 series · 3 of 3 positions shown · non-contrast
Comparison: Cervical spine plain film examination performed same
date dictated separately.

CLINICAL DATA: 41-year-old female with chronic pain in neck and
upper back off and on for 2 years. No injury. Initial encounter.

EXAM:
THORACIC SPINE - 3 VIEWS

[t-spine ap]
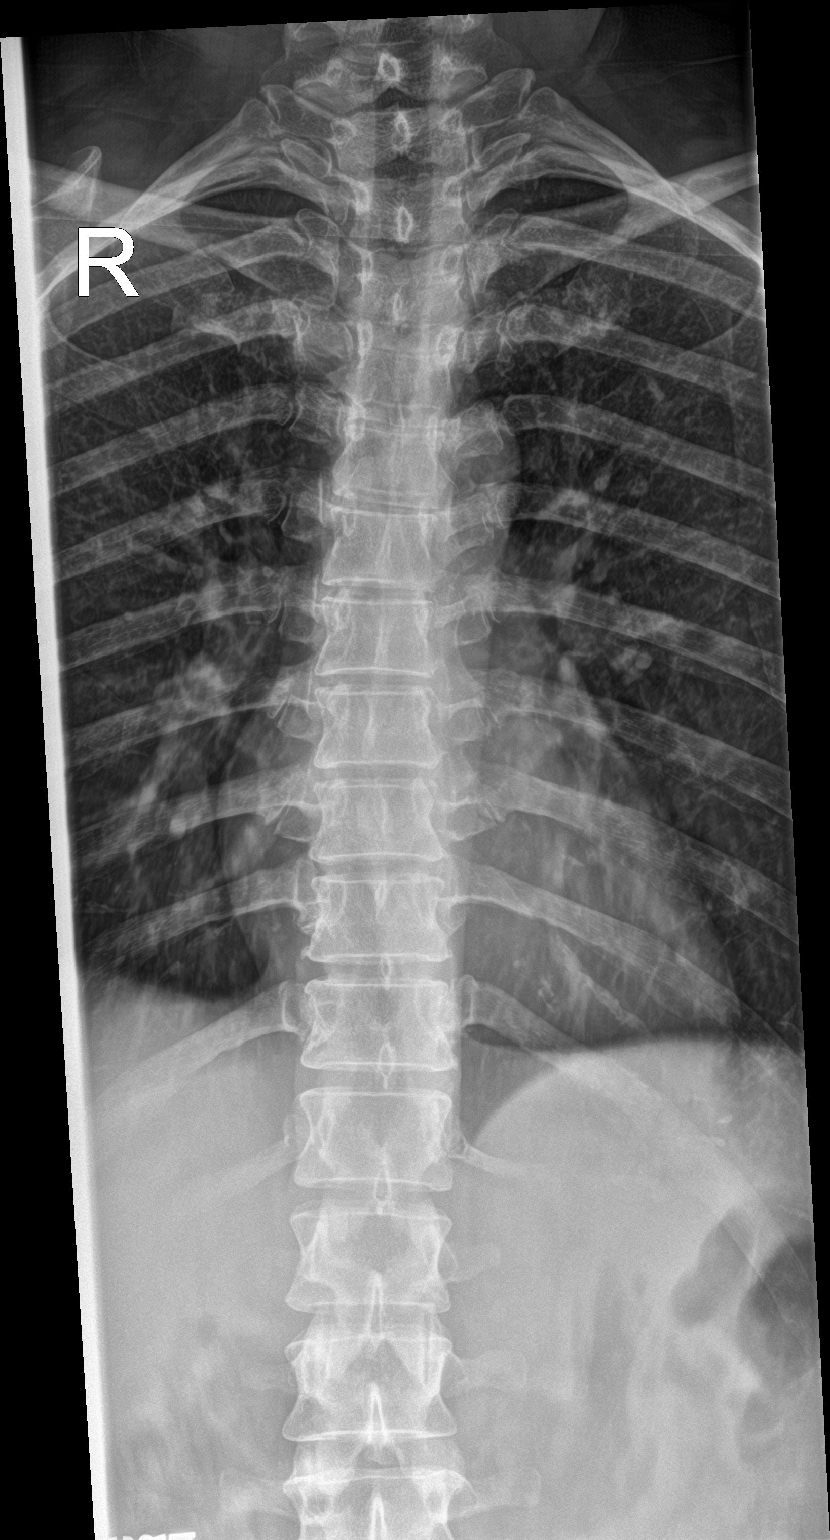

[t-spine lat]
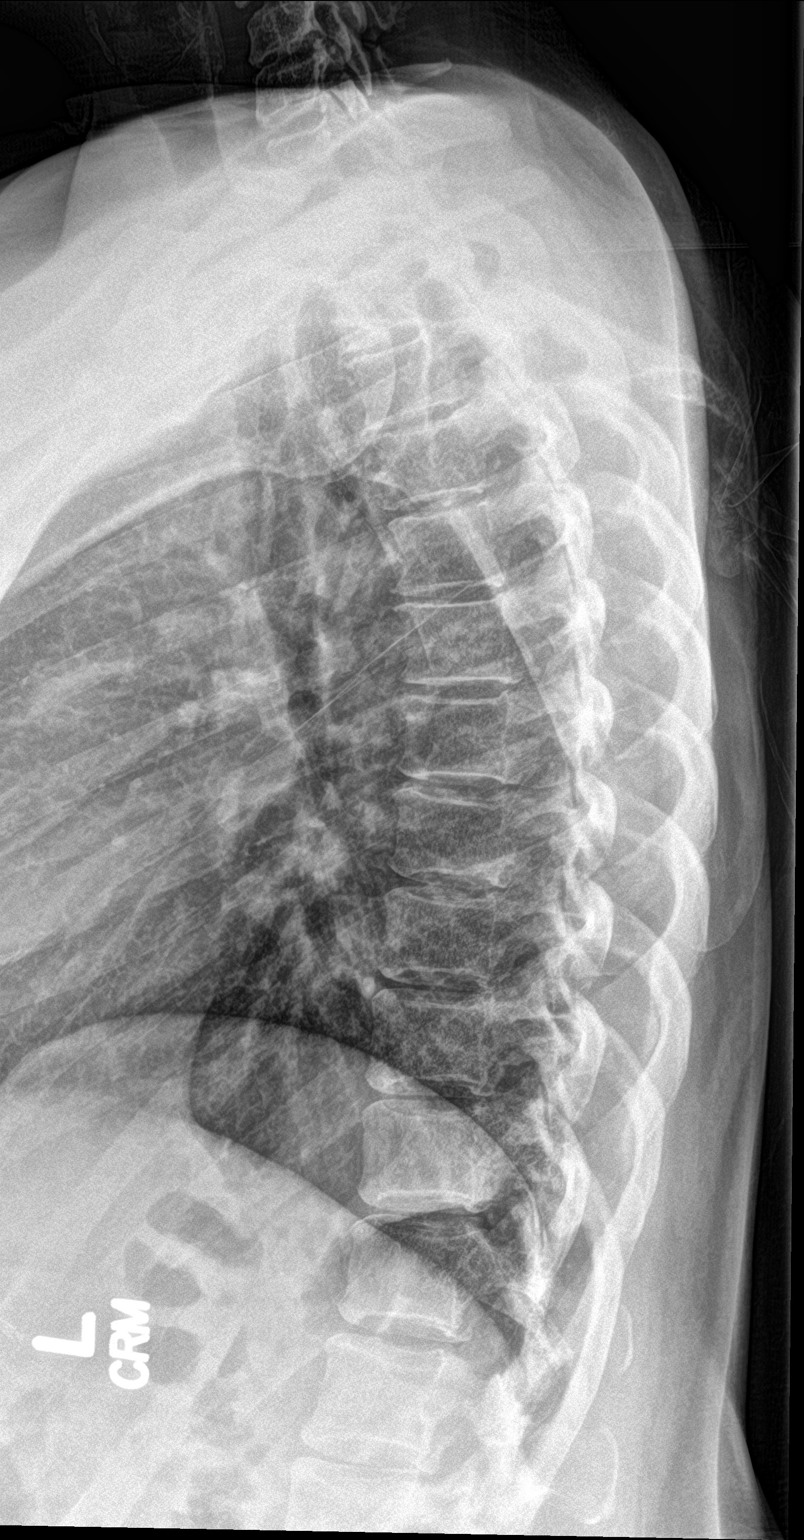

[t-spine swimmers]
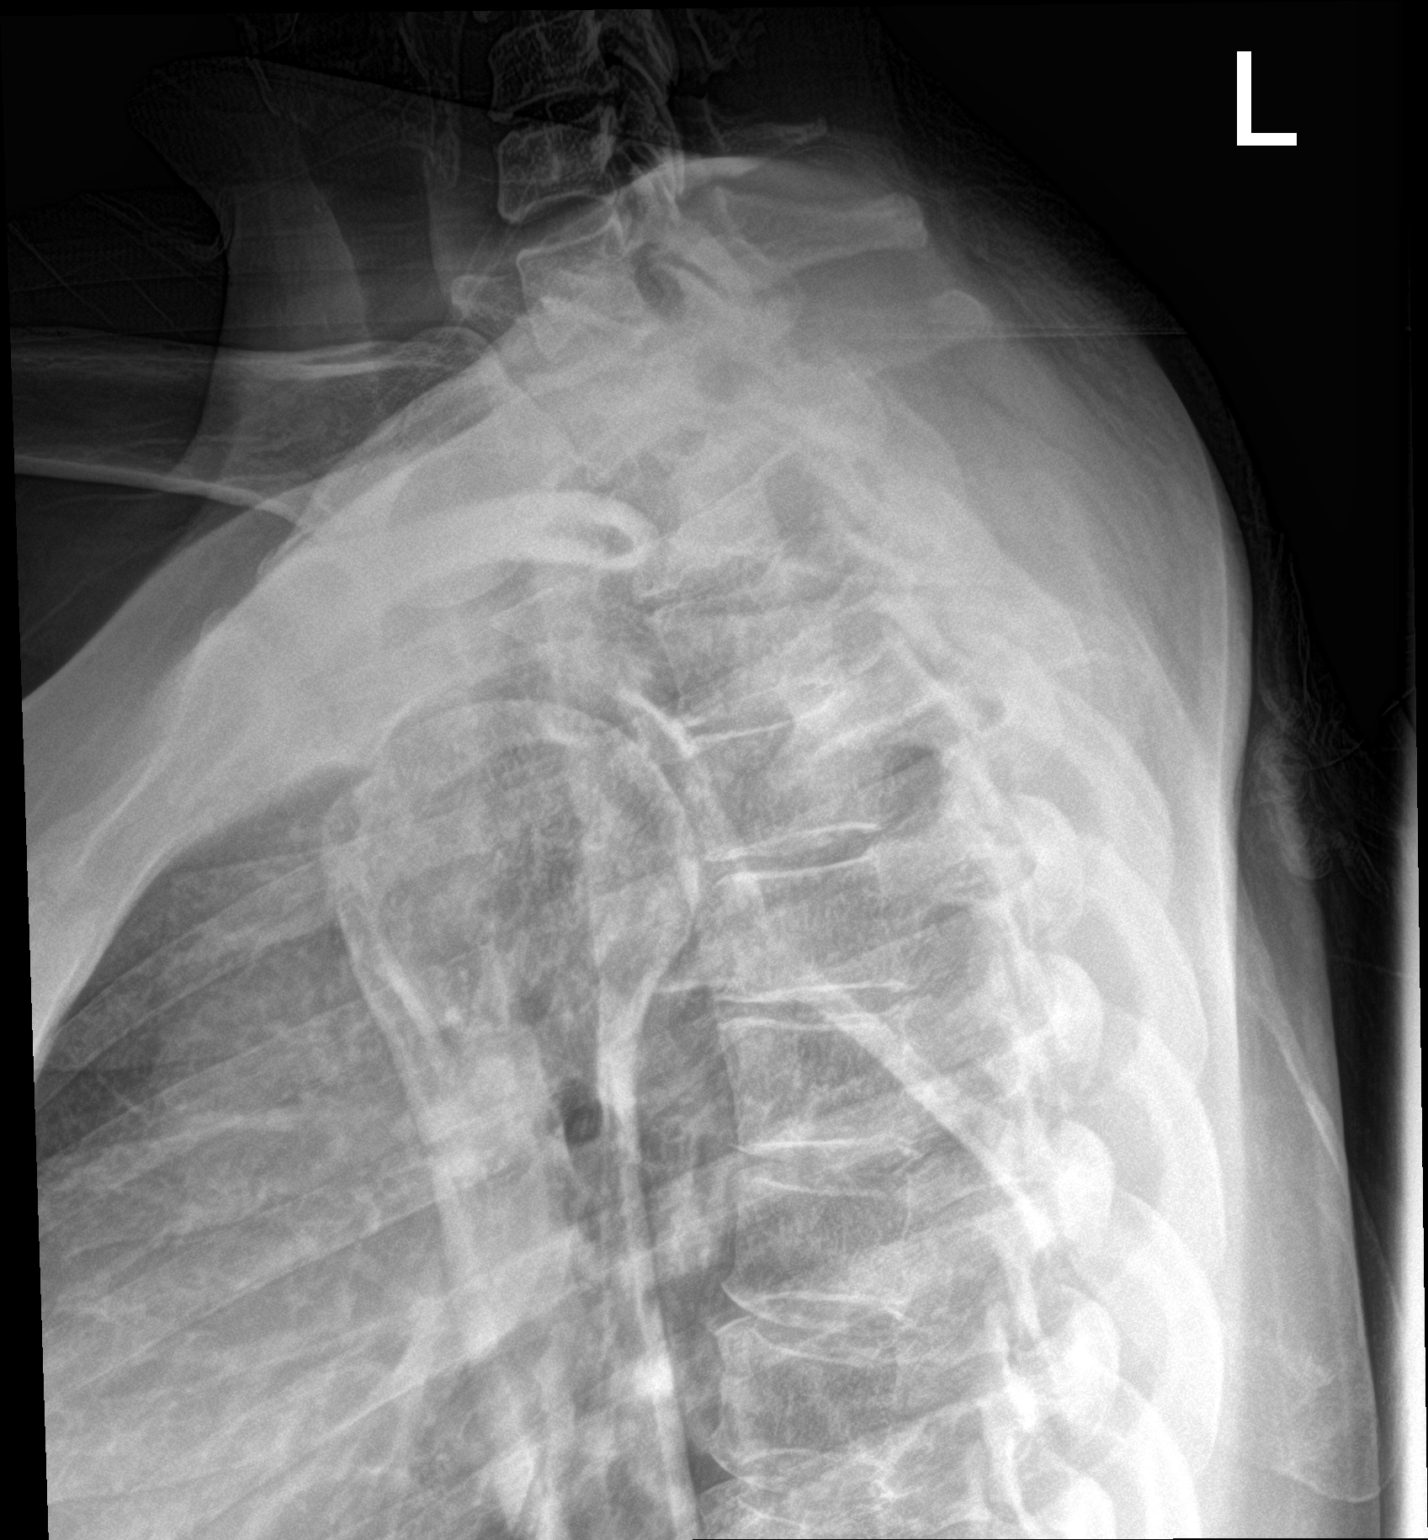

[3 of 3 positions shown; findings below may reference images not displayed]

FINDINGS: Minimal curvature thoracic spine without focal compression fracture.

Minimal degenerative changes T2-3 through T5-6.

Visualized lungs clear.
IMPRESSION: Minimal degenerative changes T2-3 through T5-6.

Minimal curvature thoracic spine.

## 2019-02-14 IMAGING — DX DG CHEST 2V
2 series · 2 of 2 positions shown · non-contrast
Comparison: 02/19/2017 thoracic spine radiographs

CLINICAL DATA: Preop chest radiograph for left eye surgery

EXAM:
CHEST  2 VIEW

[chest pa]
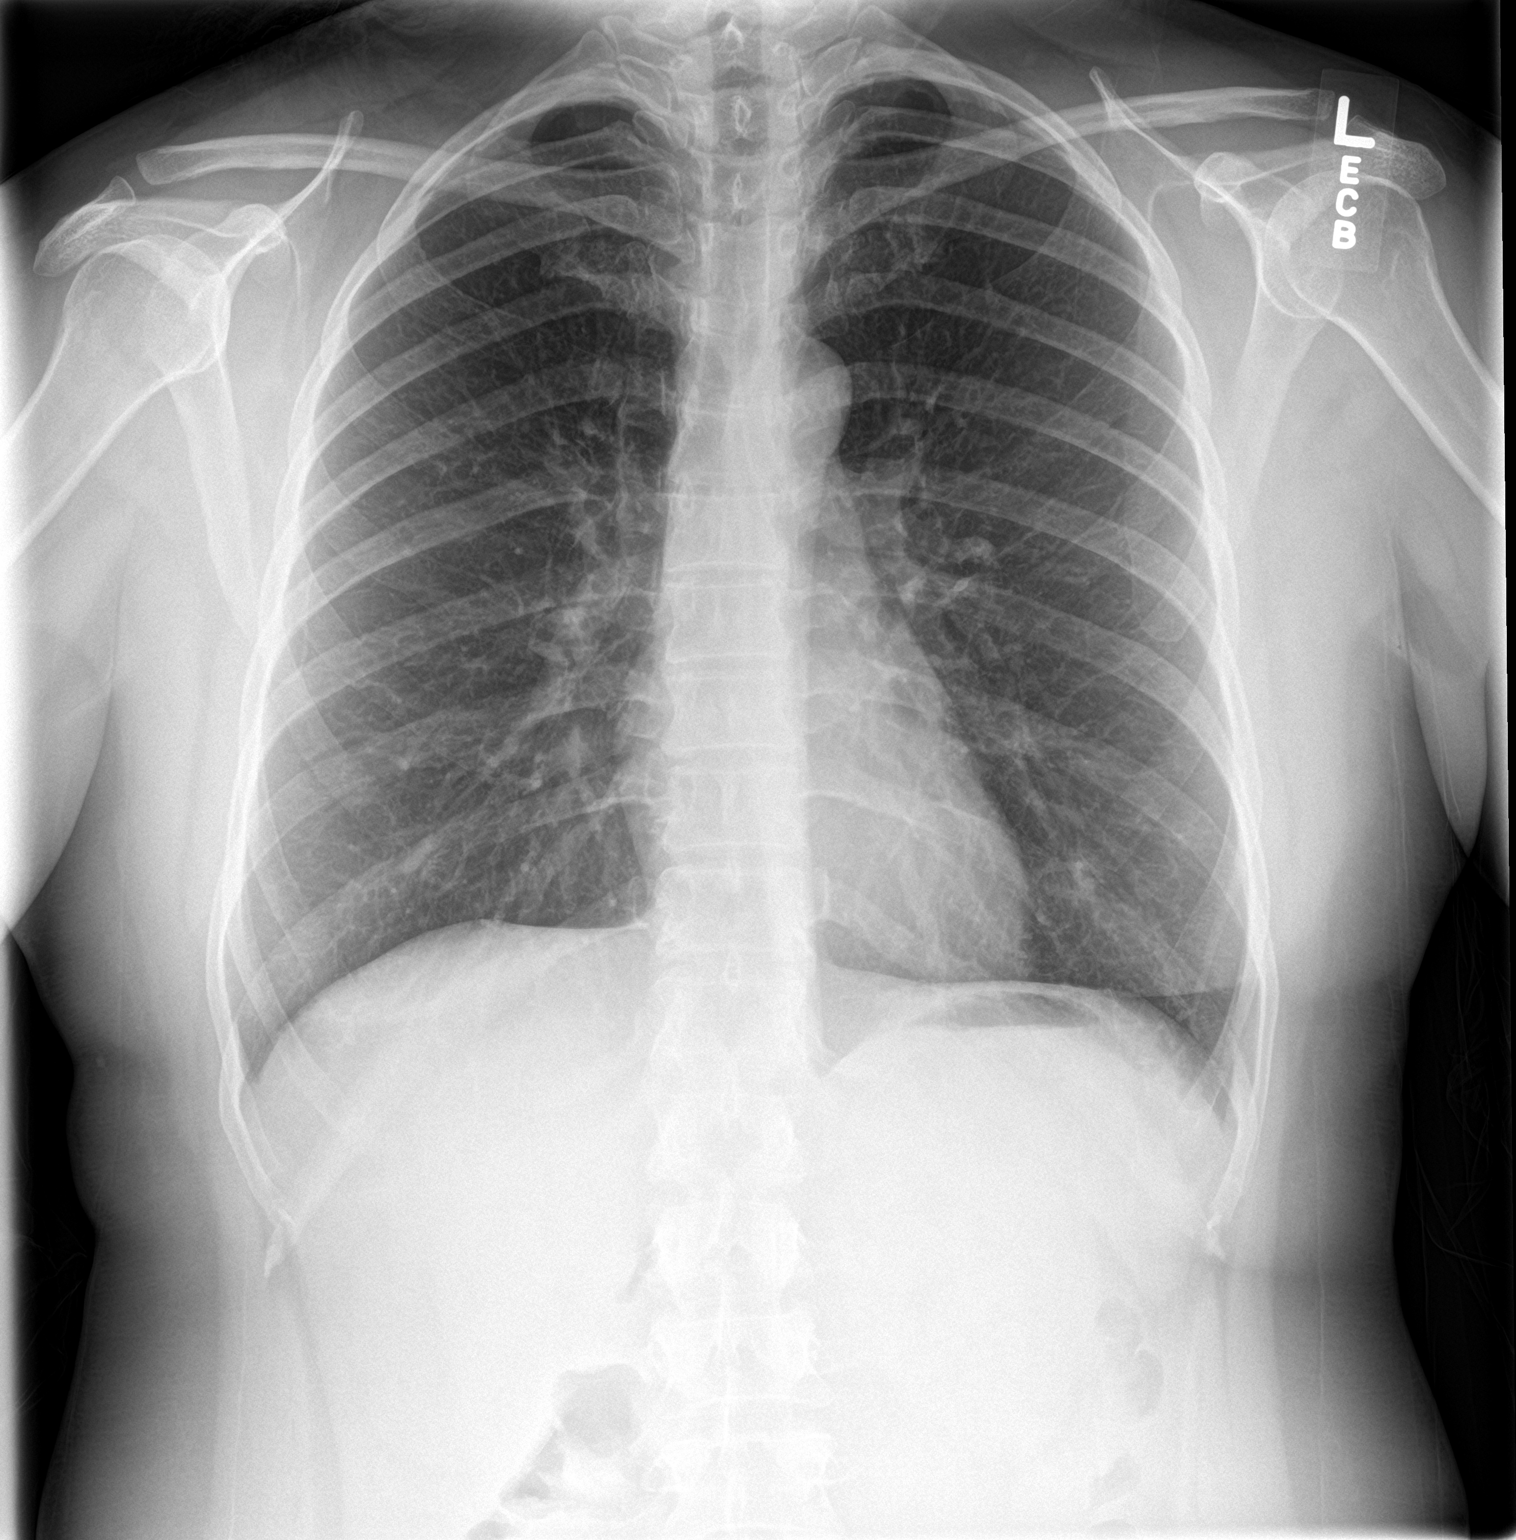

[chest lat]
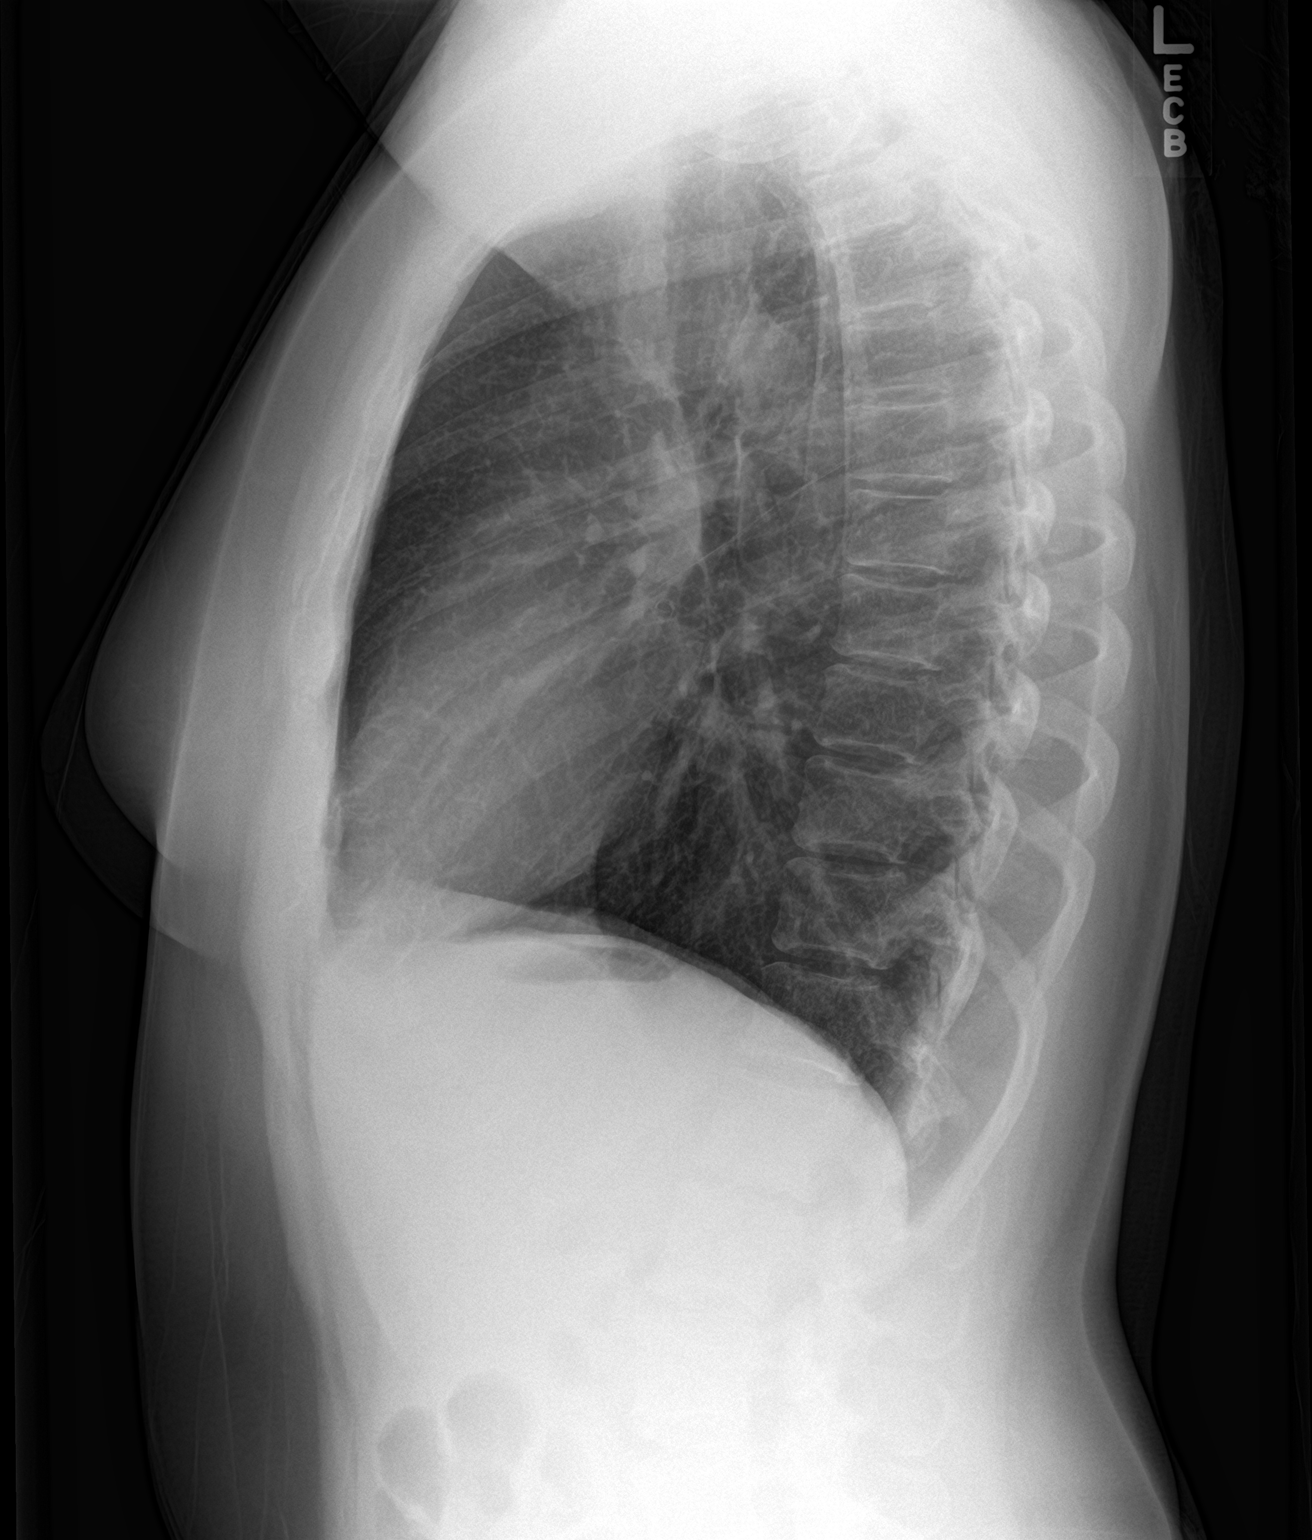

[2 of 2 positions shown; findings below may reference images not displayed]

FINDINGS: The heart size and mediastinal contours are within normal limits.
Both lungs are clear. No acute nor suspicious osseous abnormality.
IMPRESSION: No active cardiopulmonary disease.
